# Patient Record
Sex: Female | Born: 1975 | Race: Black or African American | Hispanic: No | Smoking: Never smoker
Health system: Southern US, Community
[De-identification: ages and names within clinical notes are randomized; demographics above are authoritative.]

## PROBLEM LIST (undated history)

## (undated) DIAGNOSIS — O99019 Anemia complicating pregnancy, unspecified trimester: Secondary | ICD-10-CM

## (undated) DIAGNOSIS — G43909 Migraine, unspecified, not intractable, without status migrainosus: Secondary | ICD-10-CM

## (undated) DIAGNOSIS — A749 Chlamydial infection, unspecified: Secondary | ICD-10-CM

## (undated) DIAGNOSIS — L309 Dermatitis, unspecified: Secondary | ICD-10-CM

## (undated) DIAGNOSIS — D249 Benign neoplasm of unspecified breast: Secondary | ICD-10-CM

## (undated) DIAGNOSIS — B379 Candidiasis, unspecified: Secondary | ICD-10-CM

## (undated) DIAGNOSIS — N920 Excessive and frequent menstruation with regular cycle: Secondary | ICD-10-CM

## (undated) DIAGNOSIS — A159 Respiratory tuberculosis unspecified: Secondary | ICD-10-CM

## (undated) DIAGNOSIS — A599 Trichomoniasis, unspecified: Secondary | ICD-10-CM

## (undated) DIAGNOSIS — R011 Cardiac murmur, unspecified: Secondary | ICD-10-CM

## (undated) DIAGNOSIS — L680 Hirsutism: Secondary | ICD-10-CM

## (undated) DIAGNOSIS — L659 Nonscarring hair loss, unspecified: Secondary | ICD-10-CM

## (undated) HISTORY — DX: Candidiasis, unspecified: B37.9

## (undated) HISTORY — DX: Migraine, unspecified, not intractable, without status migrainosus: G43.909

## (undated) HISTORY — PX: ABDOMINAL HYSTERECTOMY: SHX81

## (undated) HISTORY — DX: Excessive and frequent menstruation with regular cycle: N92.0

## (undated) HISTORY — PX: TUMOR REMOVAL: SHX12

## (undated) HISTORY — DX: Chlamydial infection, unspecified: A74.9

## (undated) HISTORY — DX: Trichomoniasis, unspecified: A59.9

## (undated) HISTORY — DX: Benign neoplasm of unspecified breast: D24.9

## (undated) HISTORY — PX: DILATION AND CURETTAGE OF UTERUS: SHX78

## (undated) HISTORY — DX: Anemia complicating pregnancy, unspecified trimester: O99.019

## (undated) HISTORY — DX: Nonscarring hair loss, unspecified: L65.9

## (undated) HISTORY — DX: Hirsutism: L68.0

## (undated) HISTORY — DX: Dermatitis, unspecified: L30.9

## (undated) HISTORY — PX: HYSTEROSCOPY: SHX211

## (undated) HISTORY — PX: WISDOM TOOTH EXTRACTION: SHX21

---

## 1997-09-22 ENCOUNTER — Inpatient Hospital Stay (HOSPITAL_COMMUNITY): Admission: AD | Admit: 1997-09-22 | Discharge: 1997-09-22 | Payer: Self-pay | Admitting: *Deleted

## 1997-10-26 ENCOUNTER — Other Ambulatory Visit: Admission: RE | Admit: 1997-10-26 | Discharge: 1997-10-26 | Payer: Self-pay | Admitting: Obstetrics and Gynecology

## 1997-11-24 ENCOUNTER — Encounter: Payer: Self-pay | Admitting: Obstetrics and Gynecology

## 1997-11-24 ENCOUNTER — Ambulatory Visit (HOSPITAL_COMMUNITY): Admission: RE | Admit: 1997-11-24 | Discharge: 1997-11-24 | Payer: Self-pay | Admitting: Obstetrics and Gynecology

## 1997-12-29 ENCOUNTER — Inpatient Hospital Stay (HOSPITAL_COMMUNITY): Admission: AD | Admit: 1997-12-29 | Discharge: 1997-12-29 | Payer: Self-pay | Admitting: Obstetrics and Gynecology

## 1998-01-25 ENCOUNTER — Ambulatory Visit (HOSPITAL_COMMUNITY): Admission: RE | Admit: 1998-01-25 | Discharge: 1998-01-25 | Payer: Self-pay | Admitting: Obstetrics and Gynecology

## 1998-02-03 ENCOUNTER — Inpatient Hospital Stay (HOSPITAL_COMMUNITY): Admission: AD | Admit: 1998-02-03 | Discharge: 1998-02-03 | Payer: Self-pay | Admitting: Obstetrics and Gynecology

## 1998-03-03 ENCOUNTER — Ambulatory Visit (HOSPITAL_COMMUNITY): Admission: RE | Admit: 1998-03-03 | Discharge: 1998-03-03 | Payer: Self-pay | Admitting: Obstetrics & Gynecology

## 1998-03-03 ENCOUNTER — Encounter: Payer: Self-pay | Admitting: Obstetrics & Gynecology

## 1998-03-05 ENCOUNTER — Observation Stay (HOSPITAL_COMMUNITY): Admission: AD | Admit: 1998-03-05 | Discharge: 1998-03-06 | Payer: Self-pay | Admitting: Obstetrics & Gynecology

## 1998-03-19 ENCOUNTER — Inpatient Hospital Stay (HOSPITAL_COMMUNITY): Admission: AD | Admit: 1998-03-19 | Discharge: 1998-03-19 | Payer: Self-pay | Admitting: Obstetrics and Gynecology

## 1998-03-23 ENCOUNTER — Encounter (HOSPITAL_COMMUNITY): Admission: RE | Admit: 1998-03-23 | Discharge: 1998-04-06 | Payer: Self-pay | Admitting: Obstetrics & Gynecology

## 1998-03-23 ENCOUNTER — Encounter: Payer: Self-pay | Admitting: Obstetrics & Gynecology

## 1998-03-26 ENCOUNTER — Encounter: Payer: Self-pay | Admitting: Obstetrics and Gynecology

## 1998-04-06 ENCOUNTER — Inpatient Hospital Stay (HOSPITAL_COMMUNITY): Admission: AD | Admit: 1998-04-06 | Discharge: 1998-04-10 | Payer: Self-pay | Admitting: Obstetrics and Gynecology

## 1998-12-01 ENCOUNTER — Other Ambulatory Visit: Admission: RE | Admit: 1998-12-01 | Discharge: 1998-12-01 | Payer: Self-pay | Admitting: Obstetrics and Gynecology

## 1999-01-17 DIAGNOSIS — A159 Respiratory tuberculosis unspecified: Secondary | ICD-10-CM

## 1999-01-17 HISTORY — DX: Respiratory tuberculosis unspecified: A15.9

## 1999-02-11 ENCOUNTER — Inpatient Hospital Stay (HOSPITAL_COMMUNITY): Admission: AD | Admit: 1999-02-11 | Discharge: 1999-02-11 | Payer: Self-pay | Admitting: Obstetrics and Gynecology

## 1999-03-02 ENCOUNTER — Ambulatory Visit (HOSPITAL_COMMUNITY): Admission: RE | Admit: 1999-03-02 | Discharge: 1999-03-02 | Payer: Self-pay | Admitting: Obstetrics and Gynecology

## 1999-03-02 ENCOUNTER — Encounter: Payer: Self-pay | Admitting: Obstetrics and Gynecology

## 1999-04-08 ENCOUNTER — Inpatient Hospital Stay (HOSPITAL_COMMUNITY): Admission: AD | Admit: 1999-04-08 | Discharge: 1999-04-10 | Payer: Self-pay | Admitting: Obstetrics and Gynecology

## 1999-06-07 ENCOUNTER — Emergency Department (HOSPITAL_COMMUNITY): Admission: EM | Admit: 1999-06-07 | Discharge: 1999-06-07 | Payer: Self-pay

## 1999-08-01 ENCOUNTER — Encounter: Payer: Self-pay | Admitting: Emergency Medicine

## 1999-08-01 ENCOUNTER — Emergency Department (HOSPITAL_COMMUNITY): Admission: EM | Admit: 1999-08-01 | Discharge: 1999-08-01 | Payer: Self-pay | Admitting: Emergency Medicine

## 2000-01-13 ENCOUNTER — Encounter: Admission: RE | Admit: 2000-01-13 | Discharge: 2000-01-13 | Payer: Self-pay | Admitting: Family Medicine

## 2000-01-13 ENCOUNTER — Encounter: Payer: Self-pay | Admitting: Family Medicine

## 2000-01-19 ENCOUNTER — Emergency Department (HOSPITAL_COMMUNITY): Admission: EM | Admit: 2000-01-19 | Discharge: 2000-01-20 | Payer: Self-pay | Admitting: *Deleted

## 2000-01-19 ENCOUNTER — Encounter: Payer: Self-pay | Admitting: Emergency Medicine

## 2000-01-23 ENCOUNTER — Inpatient Hospital Stay (HOSPITAL_COMMUNITY): Admission: AD | Admit: 2000-01-23 | Discharge: 2000-01-30 | Payer: Self-pay | Admitting: Family Medicine

## 2000-01-23 ENCOUNTER — Encounter: Payer: Self-pay | Admitting: Family Medicine

## 2000-01-26 ENCOUNTER — Encounter: Payer: Self-pay | Admitting: Family Medicine

## 2001-08-22 ENCOUNTER — Encounter: Admission: RE | Admit: 2001-08-22 | Discharge: 2001-08-22 | Payer: Self-pay | Admitting: Family Medicine

## 2001-08-22 ENCOUNTER — Encounter: Payer: Self-pay | Admitting: Family Medicine

## 2002-04-09 ENCOUNTER — Other Ambulatory Visit: Admission: RE | Admit: 2002-04-09 | Discharge: 2002-04-09 | Payer: Self-pay | Admitting: Obstetrics and Gynecology

## 2002-07-23 ENCOUNTER — Emergency Department (HOSPITAL_COMMUNITY): Admission: EM | Admit: 2002-07-23 | Discharge: 2002-07-23 | Payer: Self-pay

## 2002-07-29 ENCOUNTER — Encounter: Admission: RE | Admit: 2002-07-29 | Discharge: 2002-08-28 | Payer: Self-pay | Admitting: Family Medicine

## 2002-11-03 ENCOUNTER — Emergency Department (HOSPITAL_COMMUNITY): Admission: EM | Admit: 2002-11-03 | Discharge: 2002-11-03 | Payer: Self-pay | Admitting: Emergency Medicine

## 2002-11-03 ENCOUNTER — Encounter: Payer: Self-pay | Admitting: Emergency Medicine

## 2004-10-20 ENCOUNTER — Other Ambulatory Visit: Admission: RE | Admit: 2004-10-20 | Discharge: 2004-10-20 | Payer: Self-pay | Admitting: Obstetrics and Gynecology

## 2007-01-03 ENCOUNTER — Ambulatory Visit (HOSPITAL_COMMUNITY): Admission: RE | Admit: 2007-01-03 | Discharge: 2007-01-03 | Payer: Self-pay | Admitting: Obstetrics and Gynecology

## 2007-01-03 ENCOUNTER — Encounter (INDEPENDENT_AMBULATORY_CARE_PROVIDER_SITE_OTHER): Payer: Self-pay | Admitting: Obstetrics and Gynecology

## 2007-09-09 ENCOUNTER — Encounter: Admission: RE | Admit: 2007-09-09 | Discharge: 2007-09-09 | Payer: Self-pay | Admitting: Family Medicine

## 2010-05-31 NOTE — H&P (Signed)
NAMEBREEONA, WAID              ACCOUNT NO.:  [id number]   MEDICAL RECORD NO.:  [id number]          PATIENT TYPE:  AMB   LOCATION:  SDC                           FACILITY:  [facility]   PHYSICIAN:  Hal Morales, M.D.DATE OF BIRTH:  Feb 01, 1975   DATE OF ADMISSION:  01/03/2007  DATE OF DISCHARGE:                              HISTORY & PHYSICAL   HISTORY OF PRESENT ILLNESS:  The patient is a 35 year old black married  female, gravida 4, para 5-0-5-5, who presents for management of  menorrhagia.  The patient began having some menorrhagia as early as  2004; however, this remained fairly manageable.  She was initially  evaluated at that time with no evidence of fibroids.  She was having  some irregularity of her menses and used oral contraceptive pills for a  short period of time to manage that.  She has subsequently been off  birth control pills for almost 4 years, as her husband has had a  vasectomy.  She presented once again in 2006 with a mild anemia and  pica, with a hemoglobin of 10.4, thought to be due to some menorrhagia.  This corrected with iron therapy.  TSH at that time was normal.  The  patient presented most recently on November 3 complaining of menses  every 21 days for 5 days, 3 of those being very heavy.  She had also had  a daily discharge for approximately two months.  She underwent  laboratory studies including a normal TSH and FSH/LH in the 1 to 1  ratio, normal testosterone, negative GC and chlamydia cultures.  She had  likewise had a normal testosterone because, she was complaining of  increased hair growth.  She underwent a sonohysterogram showing 3  echogenic masses within the endometrial cavity, most consistent with  polyps, with the largest measuring 8 x 4 mm.  She presents for  hysteroscopy and resection of polypoid lesions.   PAST MEDICAL HISTORY:  GYN.  The patient underwent menarche at age 65  and had menses as close together as every 18 days, but usually  every 21  days with fairly heavy flow and significant cramping.  The patient had a  history of abnormal pap smears requiring cryotherapy, and they have been  normal since that time.  She also had lumps removed from her breast in  1991 and in 1992, both of which were benign.  She uses vasectomy for  contraception.   OBSTETRIC HISTORY:  The patient had vaginal deliveries in 1996 and 1998.  She had cesarean section for twin gestation in 2000 and a vaginal  delivery in 2001.   MEDICAL HISTORY:  The patient has a history of migraines and anemia, as  well as pica.   FAMILY HISTORY:  Positive for hypertension and sickle cell trait.   REVIEW OF SYSTEMS:  Negative except as mentioned above.   EXAMINATION:  GENERAL:  The patient is a well-developed, black female in  no acute distress.  VITAL SIGNS:  Blood pressure is 102/66, weight is 169 pounds, height it  5 feet 3 and 1/4 inches.  NECK:  Thyroid  is not enlarged.  BREASTS:  Without dominant masses, discharge or skin changes.  LUNGS:  Clear.  HEART:  Regular rate and rhythm.  ABDOMEN:  Soft without masses or organomegaly.  PELVIC:  EGBUS within normal limits.  The vagina is rugose.  The cervix  is without gross lesions.  The uterus is upper limits of normal size,  mobile and nontender.  ADNEXA:  No masses.  RECTOVAGINAL:  No masses.   LABORATORY STUDIES:  As noted above with the uterus measuring 8-1/2 cm x  5.19 cm.   IMPRESSION:  1. Menorrhagia.  2. Resultant anemia.   DISPOSITION:  A discussion was held with the patient concerning options  for management of her menorrhagia.  Clearly, the endometrial lesions  should be resected, and she has consented to that.  She is offered  simultaneous endometrial ablation but declines that, because she does no  want to put herself in a position where she would not be able to have  other children, in spite of the fact that her husband has now had a  vasectomy.  She will thus undergo  hysteroscopic resection of endometrial  masses at [facility] on January 03, 2007.      Hal Morales, M.D.  Electronically Signed     VPH/MEDQ  D:  12/31/2006  T:  12/31/2006  Job:  [id number]

## 2010-05-31 NOTE — Op Note (Signed)
[name], Julie [name]              ACCOUNT NO.:  [id number]   MEDICAL RECORD NO.:  [id number]          PATIENT TYPE:  AMB   LOCATION:  SDC                           [facility]ILITY:  [facility]   PHYSICIAN:  Hal Morales, M.D.DATE OF BIRTH:  10/09/1975   DATE OF PROCEDURE:  01/03/2007  DATE OF DISCHARGE:                               OPERATIVE REPORT   PREOPERATIVE DIAGNOSES:  1. Menorrhagia.  2. Endometrial polyps.   POSTOPERATIVE DIAGNOSES:  1. Menorrhagia.  2. Endometrial polyps.   OPERATION:  Hysteroscopy, D&C.   SURGEON:  Dr. Dierdre Forth.   ANESTHESIA:  General LMA.   ESTIMATED BLOOD LOSS:  Less than 10 mL.   COMPLICATIONS:  None.   FINDINGS:  The endometrium was thickened though the polypoid lesions  that had been noted on sonohysterogram could not be easily recognized.  Once the endometrial curettings were obtained, however, there were  several polypoid pieces within those curettings.  There were no other  endometrial lesions noted.   PROCEDURE:  The patient was taken to the operating room after  appropriate identification and placed on the operating table.  After the  attainment of general anesthesia, she was placed in lithotomy position.  Perineum and vagina were prepped with multiple layers of Betadine and  draped as sterile field.  The bladder was emptied with a red Robinson  catheter.  A Graves speculum was placed in the vagina and a paracervical  block achieved with a total of 10 mL of 2% Xylocaine in the 5 and 7  o'clock positions.  The cervix was grasped with a tenaculum and the  uterus sounded to 7 cm.  The cervix was then dilated to a #31 dilator  and the hysteroscope inserted.  The above-noted findings were made and  documented.  Hysteroscope was removed and all quadrants of the uterus  curetted.  The hysteroscope was reinserted and no further lesion nor  endometrial thickening were observed.  All instruments were  then removed from the vagina and the  patient awakened from general  anesthesia and taken to the recovery room in satisfactory condition  having tolerated the procedure well, with sponge and instrument counts  correct.   SPECIMENS TO PATHOLOGY:  Endometrial curettings with polypoid tissue.      Hal Morales, M.D.  Electronically Signed     VPH/MEDQ  D:  01/03/2007  T:  01/03/2007  Job:  [id number]

## 2010-06-03 NOTE — Discharge Summary (Signed)
[place]  Patient:    Julie [name], Julie [name]                     MRN: [id number] Adm. Date:  [id number] Disc. Date: [id number] Attending:  Beverely Low                           Discharge Summary  HISTORY OF PRESENT ILLNESS:  The patient is a 35 year old female who was admitted to the hospital after having a severe pneumonia which was not responsive to outpatient treatment.  The patient was started on IV antibiotics but still continued to spike a temperature up to 102.  With this, it was felt that we should get an ID consultation which was done.  Lacretia Leigh. Ninetta Lights, M.D., saw this patient  and did change the IV antibiotics.  Question of whether the patient had TB was brought forward. She had AFBs that were done and she had also tuberculin skin tests that were done.  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  At the thought of this, the patient had been placed on isolation and antituberculous drugs were started on this patient.  This was continued until her coughing had stopped.  The patient was then discharged home and would be seen in the office in several days.  It is of note that the patient was discharged on January 30, 2000.  All the rest of her tests came back negative.  She was started on isoniazid 100 mg a day, Robaxin 50 mg a day, ethambutol 700 mg per day, vitamin B6 50, pyrizimide 3 g p.o. q.d.  She will receive these medications from the health department. They will come to her house on a daily basis to administer her antituberculous drugs. DD:  02/23/00 TD:  02/24/00 Job: [id number]

## 2010-06-03 NOTE — H&P (Signed)
[facility]  Patient:    Julie [name], Julie [name]                     MRN: [id number] Adm. Date:  [id number] Attending:  Shaune Spittle Dictator:   Nigel Bridgeman, C.N.M.                         History and Physical  BRIEF HISTORY:  Julie [name] is a 35 year old gravida 4, para 2-1-0-4 at 71 weeks ho presents for induction secondary to post dates.  She was to arrive for induction at 6:30 a.m. today, however, the patient was confused as to the plan and thought she was to present to the office prior to that.  She does report sporadic uterine contractions and reports positive fetal movement.  Pregnancy has been remarkable for: 1. History of rapid labor. 2. Previous low transverse cesarean section for twin gestation with 2 previous    vaginal births. 3. History of cryotherapy. 4. First trimester bleeding. 5. Less than 1 year between pregnancies. 6. Conception on OCPs. 7. Anemia. 8. Slightly late to care.  PRENATAL LABORATORY DATA:  Blood type is A+, rh antibody negative, VDRL nonreactive, rubella titer positive, hepatitis B surface antigen negative, HIV as declined. Sickle cell test was negative from 1999.  Pap was normal.  GC and chlamydia cultures were negative.  Glucose challenge was normal.  Group B strep  culture was negative.  AFP was not done.  Hemoglobin upon entering the practice was 9.6, it was 8.5 at 28 weeks.  EDC of April 01, 1999, was established by ultrasound at approximately 22 weeks secondary to conception on OCPs.  HISTORY OF PRESENT PREGNANCY:  The patient entered care at approximately 21-1/2  weeks.  She elected VBAC.  She was started on iron during her pregnancy.  Her grandmother was hospitalized during the patients pregnancy with TB.  The patient had a negative TB skin test in August.  The rest of her pregnancy was essentially uncomplicated.  She did have a cervical dilatation to 4 cm over the last  2 weeks.  OBSTETRICAL HISTORY:  In 1996 the patient had a vaginal birth of a female infant, weight 7 pounds 13 ounces at [redacted] weeks gestation.  She was in labor approximate  hours.  She had IV medications and epidurals.  She delivered that child in [facility].  In 1998 she had a vaginal birth of a female infant weight 7 pounds 3 ounces at [redacted] weeks gestation.  She was in labor approximately 1 hour.  She had IV pain  medication.  In March of 2000, she had a C section for a 36 week twin gestation. She had two girls.  Twin A weighed 4 pounds 10 ounces, twin B weighed 4 pounds 4 ounces.  They were both in a breech position.  She did have anemia with her previous pregnancies.  MEDICAL HISTORY:  She was on oral contraceptives and Depo-Provera.  She did conceive this pregnancy on OCPs.  She had cryosurgery in 1995 but has had normal Pap since.  She has had breast biopsies in 1991 and 1992 for benign breast lumps. She reports the usual childhood illnesses.  She has had 3 or 4 yeast infections per year.  The only other surgeries were her wisdom teeth removed and her C-section  which was a low transverse in March of 2000.  ALLERGIES:  No known drug allergies.  FAMILY HISTORY:  Her maternal grandmother and maternal aunt are hypertensive and on medication.  Her mother has varicosities.  Her maternal first cousin has insulin-dependent diabetes. Her maternal first cousin also has thyroid problems. Her sister is a smoker.  GENETIC HISTORY:  Is remarkable for the father of the babys first cousin having a child with Downs syndrome.  SOCIAL HISTORY:  The patient is recently married.  The father of the baby is involved and supportive.  His name is Counsellor.  The patient is Tree surgeon.  She is of the [place] faith.  She is college educated.  She has been followed by [facility]. She denies any alcohol, rug or tobacco use during this pregnancy.  She  is employed with the [employer].  PHYSICAL EXAMINATION:  VITAL SIGNS:  Vital signs are stable.  Patient is afebrile.  HEENT:  Within normal limits.  LUNGS:  Breath sounds are clear.  HEART:  Regular rate and rhythm without murmur.  BREASTS:  Soft and nontender.  ABDOMEN:  Fundal height is approximately 38 cm.  Estimated fetal weight is 7 to  7-1/2 pounds.  Uterine contractions are irregular and mild.  Cervical exam per office exam earlier last week was 4 cm vertex presentation.  Fetal heart rate is reassuring.  There are no decelerations noted.  EXTREMITIES:  Deep tendon reflexes are 2+ without clonus.  There is a trace edema noted.  IMPRESSION: 1. Intrauterine pregnancy at 41 weeks. 2. Advanced cervical change. 3. Anemia. 4. Previous low transverse cesarean section with 2 previous vaginal births. 5. Less than months between pregnancies. 6. Anemia. 7. History of cryotherapy.  PLAN: 1. Admit to birthing suite for consult with Dr. Dierdre Forth as attending    physician. 2. Routine physician orders. 3. Dr. Pennie Rushing plans artificial rupture of membranes. 4. Anticipate normal spontaneous vaginal birth. DD:  04/08/99 TD:  04/08/99 Job: [id number]

## 2010-10-21 LAB — CBC
HCT: 29.7 — ABNORMAL LOW
Hemoglobin: 9.8 — ABNORMAL LOW
MCHC: 33.1
MCV: 83
Platelets: 354
RBC: 3.57 — ABNORMAL LOW
RDW: 17.4 — ABNORMAL HIGH
WBC: 4.7

## 2010-10-21 LAB — PREGNANCY, URINE: Preg Test, Ur: NEGATIVE

## 2011-05-04 ENCOUNTER — Ambulatory Visit: Payer: Self-pay | Admitting: Obstetrics and Gynecology

## 2011-06-09 ENCOUNTER — Telehealth: Payer: Self-pay | Admitting: Obstetrics and Gynecology

## 2011-06-09 NOTE — Telephone Encounter (Signed)
Pt c/o symptoms of BV. Informed pt that she must be seen before anything can be prescribed. Pt has appt with Dr. Pennie Rushing 06/22/11 and will discuss problems at that appt. Pt voiced understanding.

## 2011-06-09 NOTE — Telephone Encounter (Signed)
Triage/epic/gen. [facility].

## 2011-06-22 ENCOUNTER — Encounter: Payer: Self-pay | Admitting: Obstetrics and Gynecology

## 2011-06-22 ENCOUNTER — Ambulatory Visit (INDEPENDENT_AMBULATORY_CARE_PROVIDER_SITE_OTHER): Payer: BC Managed Care – PPO | Admitting: Obstetrics and Gynecology

## 2011-06-22 VITALS — BP 128/68 | Ht 64.0 in | Wt 176.0 lb

## 2011-06-22 DIAGNOSIS — N920 Excessive and frequent menstruation with regular cycle: Secondary | ICD-10-CM

## 2011-06-22 DIAGNOSIS — Z309 Encounter for contraceptive management, unspecified: Secondary | ICD-10-CM

## 2011-06-22 MED ORDER — ERGOCALCIFEROL 1.25 MG (50000 UT) PO CAPS: ORAL_CAPSULE | ORAL | Status: DC

## 2011-06-22 MED ORDER — MISOPROSTOL 200 MCG PO TABS: ORAL_TABLET | ORAL | Status: DC

## 2011-06-22 NOTE — Progress Notes (Signed)
Pt here regarding change in b/c. Currently using the NuvaRing. States that she is getting off schedule with that and the price has gone up. Wanting to talk about something more permanent. Also c/o itching, uncomfortable feeling in vaginal area. Was seen by family doctor. Was told that nothing was seen wanting to get that checked today. No odor small yellow d/c. Pt request std testing   Subjective: The patient wants to change to a different birth control method but does not want permanent sterilization. She has heavy menstrual periods unless she uses the NuvaRing and therefore wants something that will assist with that as well  Objective: BP 128/68  Ht 5\' 4"  (1.626 m)  Wt 176 lb (79.833 kg)  BMI 30.21 kg/m2  LMP 05/25/2011   Pelvic: Pelvic exam: normal external genitalia, vulva, vagina, cervix, uterus and adnexa, VULVA: normal appearing vulva with no masses, tenderness or lesions, VAGINA: normal appearing vagina with normal color and discharge, no lesions, CERVIX: normal appearing cervix without discharge or lesions, UTERUS: nl shape, consistency and nontender, upper limits normal size, ADNEXA: normal adnexa in size, nontender and no masses  Impression: Desires long-term contraception   History of menorrhagia   Desires STD testing  Recommendations: GC chlamydia HIV and RPR were performed    Discussion held with the patient concerning options for contraception. The Mirena IUD seems a good choice for her. The risks and benefits of that method were reviewed. She wishes to have the Mirena IUD inserted as soon as possible. She is currently due to discontinue her NuvaRing so will have no breaking contraception.Marland Kitchen

## 2011-06-22 NOTE — Patient Instructions (Signed)
Levonorgestrel intrauterine device (IUD)  MIRENA What is this medicine? LEVONORGESTREL IUD (LEE voe nor jes trel) is a contraceptive (birth control) device. It is used to prevent pregnancy and to treat heavy bleeding that occurs during your period. It can be used for up to 5 years. This medicine may be used for other purposes; ask your health care provider or pharmacist if you have questions. What should I tell my health care provider before I take this medicine? They need to know if you have any of these conditions: -abnormal Pap smear -cancer of the breast, uterus, or cervix -diabetes -endometritis -genital or pelvic infection now or in the past -have more than one sexual partner or your partner has more than one partner -heart disease -history of an ectopic or tubal pregnancy -immune system problems -IUD in place -liver disease or tumor -problems with blood clots or take blood-thinners -use intravenous drugs -uterus of unusual shape -vaginal bleeding that has not been explained -an unusual or allergic reaction to levonorgestrel, other hormones, silicone, or polyethylene, medicines, foods, dyes, or preservatives -pregnant or trying to get pregnant -breast-feeding How should I use this medicine? This device is placed inside the uterus by a health care professional. Talk to your pediatrician regarding the use of this medicine in children. Special care may be needed. Overdosage: If you think you have taken too much of this medicine contact a poison control center or emergency room at once. NOTE: This medicine is only for you. Do not share this medicine with others. What if I miss a dose? This does not apply. What may interact with this medicine? Do not take this medicine with any of the following medications: -amprenavir -bosentan -fosamprenavir This medicine may also interact with the following medications: -aprepitant -barbiturate medicines for inducing sleep or treating  seizures -bexarotene -griseofulvin -medicines to treat seizures like carbamazepine, ethotoin, felbamate, oxcarbazepine, phenytoin, topiramate -modafinil -pioglitazone -rifabutin -rifampin -rifapentine -some medicines to treat HIV infection like atazanavir, indinavir, lopinavir, nelfinavir, tipranavir, ritonavir -St. John's wort -warfarin This list may not describe all possible interactions. Give your health care provider a list of all the medicines, herbs, non-prescription drugs, or dietary supplements you use. Also tell them if you smoke, drink alcohol, or use illegal drugs. Some items may interact with your medicine. What should I watch for while using this medicine? Visit your doctor or health care professional for regular check ups. See your doctor if you or your partner has sexual contact with others, becomes HIV positive, or gets a sexual transmitted disease. This product does not protect you against HIV infection (AIDS) or other sexually transmitted diseases. You can check the placement of the IUD yourself by reaching up to the top of your vagina with clean fingers to feel the threads. Do not pull on the threads. It is a good habit to check placement after each menstrual period. Call your doctor right away if you feel more of the IUD than just the threads or if you cannot feel the threads at all. The IUD may come out by itself. You may become pregnant if the device comes out. If you notice that the IUD has come out use a backup birth control method like condoms and call your health care provider. Using tampons will not change the position of the IUD and are okay to use during your period. What side effects may I notice from receiving this medicine? Side effects that you should report to your doctor or health care professional as soon as possible: -  allergic reactions like skin rash, itching or hives, swelling of the face, lips, or tongue -fever, flu-like symptoms -genital sores -high  blood pressure -no menstrual period for 6 weeks during use -pain, swelling, warmth in the leg -pelvic pain or tenderness -severe or sudden headache -signs of pregnancy -stomach cramping -sudden shortness of breath -trouble with balance, talking, or walking -unusual vaginal bleeding, discharge -yellowing of the eyes or skin Side effects that usually do not require medical attention (report to your doctor or health care professional if they continue or are bothersome): -acne -breast pain -change in sex drive or performance -changes in weight -cramping, dizziness, or faintness while the device is being inserted -headache -irregular menstrual bleeding within first 3 to 6 months of use -nausea This list may not describe all possible side effects. Call your doctor for medical advice about side effects. You may report side effects to FDA at 1-800-FDA-1088. Where should I keep my medicine? This does not apply. NOTE: This sheet is a summary. It may not cover all possible information. If you have questions about this medicine, talk to your doctor, pharmacist, or health care provider.  2012, Elsevier/Gold Standard. (01/24/2008 6:39:08 PM)

## 2011-06-23 ENCOUNTER — Encounter: Payer: Self-pay | Admitting: Obstetrics and Gynecology

## 2011-06-23 ENCOUNTER — Encounter: Payer: BC Managed Care – PPO | Admitting: Obstetrics and Gynecology

## 2011-06-23 ENCOUNTER — Ambulatory Visit (INDEPENDENT_AMBULATORY_CARE_PROVIDER_SITE_OTHER): Payer: BC Managed Care – PPO | Admitting: Obstetrics and Gynecology

## 2011-06-23 VITALS — BP 140/78 | Temp 98.9°F | Ht 64.0 in | Wt 176.0 lb

## 2011-06-23 DIAGNOSIS — N920 Excessive and frequent menstruation with regular cycle: Secondary | ICD-10-CM | POA: Insufficient documentation

## 2011-06-23 DIAGNOSIS — IMO0001 Reserved for inherently not codable concepts without codable children: Secondary | ICD-10-CM

## 2011-06-23 DIAGNOSIS — Z309 Encounter for contraceptive management, unspecified: Secondary | ICD-10-CM

## 2011-06-23 DIAGNOSIS — Z30430 Encounter for insertion of intrauterine contraceptive device: Secondary | ICD-10-CM

## 2011-06-23 LAB — HEPATITIS C ANTIBODY: HCV Ab: NEGATIVE

## 2011-06-23 LAB — HIV ANTIBODY (ROUTINE TESTING W REFLEX): HIV: NONREACTIVE

## 2011-06-23 LAB — GC/CHLAMYDIA PROBE AMP, GENITAL
Chlamydia, DNA Probe: NEGATIVE
GC Probe Amp, Genital: NEGATIVE

## 2011-06-23 LAB — POCT URINE PREGNANCY: Preg Test, Ur: NEGATIVE

## 2011-06-23 MED ORDER — LEVONORGESTREL 20 MCG/24HR IU IUD
INTRAUTERINE_SYSTEM | Freq: Once | INTRAUTERINE | Status: AC
  Administered 2011-06-23: 13:00:00 via INTRAUTERINE

## 2011-06-23 NOTE — Patient Instructions (Addendum)
Intrauterine Device Insertion Care After Refer to this sheet in the next few weeks. These instructions provide you with information on caring for yourself after your procedure. Your caregiver may also give you more specific instructions. Your treatment has been planned according to current medical practices, but problems sometimes occur. Call your caregiver if you have any problems or questions after your procedure. HOME CARE INSTRUCTIONS   Only take over-the-counter or prescription medicines for pain, discomfort, or fever as directed by your caregiver. Do not use aspirin. This may increase bleeding.   Check your IUD to make sure it is in place before you resume sexual activity. You should be able to feel the strings. If you cannot feel the strings, something may be wrong. The IUD may have fallen out of the uterus, or the uterus may have been punctured (perforated) during placement. Also, if the strings are getting longer, it may mean that the IUD is being forced out of the uterus. You no longer have full protection from pregnancy if any of these problems occur.   You may resume sexual intercourse if you are not having problems with the IUD. The IUD is considered immediately effective.   You may resume normal activities.   Keep all follow-up appointments to be sure your IUD has remained in place. After the first exam, yearly exams are advised, unless you cannot feel the strings of your IUD.   Continue to check that the IUD is still in place by feeling for the strings after every menstrual period.  SEEK MEDICAL CARE IF:   You have bleeding that is heavier or lasts longer than a normal menstrual cycle.   You have a fever.   You have increasing cramps or abdominal pain not relieved with medicine.   You have abdominal pain that does not seem to be related to the same area of earlier cramping and pain.   You are lightheaded, unusually weak, or faint.   You have abnormal vaginal discharge or  smells.   You have pain during sexual intercourse.   You cannot feel the IUD strings, or the IUD string has gotten longer.   You feel the IUD at the opening of the cervix in the vagina.   You think you are pregnant, or you miss your menstrual period.   The IUD string is hurting your sex partner.  Document Released: 08/31/2010 Document Revised: 12/22/2010 Document Reviewed: 08/31/2010 [facility] Patient Information 2012 [place], [place].  D/C Nuvaring on 06/26/11

## 2011-06-23 NOTE — Progress Notes (Signed)
IUD: MIRENA LOT#: [id number] Exp: 09/15 UPT: negative GC/CHLAMYDIA: negative CONSENT SIGNED: yes DISINFECTION WITH Betadine X3 UTERUS SOUNDED AT 7 CM IUD INSERTED PER PROTOCOLyes COMPLICATION:none PATIENT INSTRUCTED TO CALL IS FEVER OR ABNORMAL PAIN:yes PATIENT INSTRUCTED ON HOW TO CHECK IUD STRINGS: yes FOLLOW UP APPT: 6weeks  D/C NUVARING ON 06/26/11

## 2011-07-03 ENCOUNTER — Telehealth: Payer: Self-pay | Admitting: Obstetrics and Gynecology

## 2011-07-03 NOTE — Telephone Encounter (Signed)
Tc to pt per telephone call. Pt c/o vaginal discharge with odor. C/o vaginal and anal itching s/p Mirena insertion. Wants a rx for BV. Informed pt needs appt for eval. Pt will call back to sched an appt for eval. Pt told all STD testing negative from 06/23/11. Pt agrees.

## 2011-07-03 NOTE — Telephone Encounter (Signed)
Lm on vm to cb per telephone call.  

## 2011-08-08 ENCOUNTER — Ambulatory Visit (INDEPENDENT_AMBULATORY_CARE_PROVIDER_SITE_OTHER): Payer: BC Managed Care – PPO | Admitting: Obstetrics and Gynecology

## 2011-08-08 ENCOUNTER — Encounter: Payer: Self-pay | Admitting: Obstetrics and Gynecology

## 2011-08-08 VITALS — BP 118/76 | HR 74 | Wt 179.0 lb

## 2011-08-08 DIAGNOSIS — Z124 Encounter for screening for malignant neoplasm of cervix: Secondary | ICD-10-CM

## 2011-08-08 NOTE — Progress Notes (Signed)
AEX  Subjective:    Julie [name] is a 36 y.o. female, [id number], who presents for an annual exam. She had a Mirena IUD placed 6 weeks ago.The patient reports:normal menses, no abnormal bleeding, pelvic pain or discharge  Contraception:IUD Last mammogram: patient has never had a mammogram  Last pap: was normal October  2012.  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 Marital Status: Married    Spouse Name: N/A    Number of Children: N/A  . Years of Education: N/A   Social History Main Topics  . Smoking status: Never Smoker   . Smokeless tobacco: Never Used  . Alcohol Use: No  . Drug Use: No  . Sexually Active: Yes    Birth Control/ Protection: Inserts   Other Topics Concern  . None   Social History Narrative  . None    Menstrual cycle:   LMP: Patient's last menstrual period was 07/31/2011.           Cycle: see above  The following portions of the patient's history were reviewed and updated as appropriate: allergies, current medications, past family history, past medical history, past social history, past surgical history and problem list.  Review of Systems Pertinent items are noted in HPI. Breast:Negative for breast lump,nipple discharge or nipple retraction Gastrointestinal: Negative for abdominal pain, change in bowel habits or rectal bleeding Urinary:negative   Objective:    BP 118/76  Pulse 74  Wt 179 lb (81.194 kg)  LMP 07/31/2011    Weight:  Wt Readings from Last 1 Encounters:  08/08/11 179 lb (81.194 kg)          BMI: There is no height on file to calculate BMI.  General Appearance: Alert, appropriate appearance for age. No acute  distress HEENT: Grossly normal Neck / Thyroid: Supple, no masses, nodes or enlargement Lungs: clear to auscultation bilaterally Back: No CVA tenderness Breast Exam: No masses or nodes.No dimpling, nipple retraction or discharge. Cardiovascular: Regular rate and rhythm. S1, S2, no murmur Gastrointestinal: Soft, non-tender, no masses or organomegaly Pelvic Exam: External genitalia: normal general appearance Urinary system: urethral meatus normal Vaginal: normal mucosa without prolapse or lesions Cervix: normal appearance and IUD string visualized Adnexa: normal bimanual exam Uterus: upper limits nl sized Rectovaginal: normal rectal, no masses Lymphatic Exam: Non-palpable nodes in neck, clavicular, axillary, or inguinal regions Skin: no rash or abnormalities Neurologic: Normal gait and speech, no tremor  Psychiatric: Alert and oriented, appropriate affect.   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  RTO  For eval if no improvement STD screening: declined Contraception:IUD      HAYGOOD,VANESSA PMD

## 2011-08-08 NOTE — Progress Notes (Signed)
Pt is here today for a 6 week follow -up on her IUD. Pt stated cycle was a little longer and lighter with some cramping. No other issues .

## 2011-08-09 ENCOUNTER — Telehealth: Payer: Self-pay
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
## 2011-08-09 NOTE — Telephone Encounter (Signed)
Tc from pt per telephone call. Told pt vph recs as follows: Informed pt to try otc Liquibeads 2x/weekly. Pt to call the office for an appt if sx's reocur. Pt voices understanding.

## 2011-08-10 LAB — PAP IG W/ RFLX HPV ASCU

## 2011-11-26 ENCOUNTER — Emergency Department (HOSPITAL_BASED_OUTPATIENT_CLINIC_OR_DEPARTMENT_OTHER)
Admission: EM | Admit: 2011-11-26 | Discharge: 2011-11-26 | Disposition: A | Discharge status: Home / Self Care | Payer: No Typology Code available for payment source | Attending: Emergency Medicine | Admitting: Emergency Medicine

## 2011-11-26 ENCOUNTER — Encounter (HOSPITAL_BASED_OUTPATIENT_CLINIC_OR_DEPARTMENT_OTHER): Payer: Self-pay | Admitting: *Deleted

## 2011-11-26 DIAGNOSIS — Z862 Personal history of diseases of the blood and blood-forming organs and certain disorders involving the immune mechanism: Secondary | ICD-10-CM | POA: Insufficient documentation

## 2011-11-26 DIAGNOSIS — Z8619 Personal history of other infectious and parasitic diseases: Secondary | ICD-10-CM | POA: Insufficient documentation

## 2011-11-26 DIAGNOSIS — N920 Excessive and frequent menstruation with regular cycle: Secondary | ICD-10-CM | POA: Insufficient documentation

## 2011-11-26 DIAGNOSIS — Y9389 Activity, other specified: Secondary | ICD-10-CM | POA: Insufficient documentation

## 2011-11-26 DIAGNOSIS — Z79899 Other long term (current) drug therapy: Secondary | ICD-10-CM | POA: Insufficient documentation

## 2011-11-26 DIAGNOSIS — L680 Hirsutism: Secondary | ICD-10-CM | POA: Insufficient documentation

## 2011-11-26 DIAGNOSIS — T148XXA Other injury of unspecified body region, initial encounter: Secondary | ICD-10-CM

## 2011-11-26 DIAGNOSIS — IMO0002 Reserved for concepts with insufficient information to code with codable children: Secondary | ICD-10-CM | POA: Insufficient documentation

## 2011-11-26 DIAGNOSIS — G43909 Migraine, unspecified, not intractable, without status migrainosus: Secondary | ICD-10-CM | POA: Insufficient documentation

## 2011-11-26 DIAGNOSIS — D249 Benign neoplasm of unspecified breast: Secondary | ICD-10-CM | POA: Insufficient documentation

## 2011-11-26 MED ORDER — IBUPROFEN 800 MG PO TABS: 800.0000 mg | ORAL_TABLET | Freq: Three times a day (TID) | ORAL | Status: DC
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
## 2011-11-27 NOTE — ED Provider Notes (Signed)
Medical screening examination/treatment/procedure(s) were performed by non-physician practitioner and as supervising physician I was immediately available for consultation/collaboration.  Geoffery Lyons, MD 11/27/11 [phone]

## 2012-03-06 ENCOUNTER — Encounter: Payer: Self-pay | Admitting: Obstetrics and Gynecology

## 2012-03-06 ENCOUNTER — Ambulatory Visit: Payer: BC Managed Care – PPO | Admitting: Obstetrics and Gynecology

## 2012-03-06 VITALS — BP 110/72 | Temp 97.8°F | Wt 176.0 lb

## 2012-03-06 DIAGNOSIS — B9689 Other specified bacterial agents as the cause of diseases classified elsewhere: Secondary | ICD-10-CM

## 2012-03-06 LAB — POCT WET PREP (WET MOUNT): pH: 5

## 2012-03-06 MED ORDER — AMBULATORY NON FORMULARY MEDICATION: 600.0000 mg | Status: DC

## 2012-03-06 MED ORDER — METRONIDAZOLE 0.75 % VA GEL: 1.0000 | Freq: Two times a day (BID) | VAGINAL | Status: DC

## 2012-03-06 MED ORDER — METRONIDAZOLE 500 MG PO TABS: 500.0000 mg | ORAL_TABLET | Freq: Two times a day (BID) | ORAL | Status: DC

## 2012-03-06 NOTE — Progress Notes (Signed)
36 YO complaining of vaginal discharge with burning x [redacted] weeks along with vaginal odor.  O: Pelvic: EGBUS-wnl, vagina-thin grey discharge, cervix-string visible  Wet prep- ph-5.0, whiff-positive,  many clue cells  A: Bacterial Vaginosis  P: Boric Acid Capsules 600 mg  #30 1 pv twice weekly x 12 weeks then prn      Metrogel Vaginal  #1 1 appl pv qd x 5 days no refills      Perineal hygiene      RTO-as scheduled or prn

## 2012-03-06 NOTE — Patient Instructions (Addendum)
Avoid: - excess soap on genital area (consider using plain oatmeal soap) - use of powder or sprays in genital area - douching - wearing underwear to bed (except with menses) - using more than is directed detergent when washing clothes - tight fitting garments around genital area - excess sugar intake  [place] pharmacies that carry Boric Acid Capsules/Suppositories:  [facility] [address] (only)  [phone]  [facility]  [address] ([facility])  [phone]  [facility] ([place])  [address] [phone]  [facility]  [address] [phone]  [facility] [address] [place], [place] [phone]     

## 2012-03-06 NOTE — Progress Notes (Signed)
Vaginal discharge: clearthin mucoid Itching / Burning: yes Fever: no  Symptoms have been present for 2 weeks. Has used over-the-counter treatment: no Associated symptoms:  Pelvic pain: no       Dyspareunia: no     Odor:  yes  History of STD:  trich STD screen:declined

## 2012-04-11 ENCOUNTER — Other Ambulatory Visit: Payer: Self-pay | Admitting: Obstetrics and Gynecology

## 2012-04-11 LAB — CBC
HCT: 38.3 % (ref 36.0–46.0)
Hemoglobin: 12.8 g/dL (ref 12.0–15.0)
MCHC: 33.4 g/dL (ref 30.0–36.0)
RBC: 4.17 MIL/uL (ref 3.87–5.11)

## 2012-04-14 LAB — VITAMIN D PNL(25-HYDRXY+1,25-DIHY)-BLD: Vit D, 25-Hydroxy: <10 ng/mL — ABNORMAL LOW (ref 30–89)

## 2012-04-25 ENCOUNTER — Ambulatory Visit
Admission: RE | Admit: 2012-04-25 | Discharge: 2012-04-25 | Disposition: A | Discharge status: Home / Self Care | Payer: BC Managed Care – PPO | Source: Ambulatory Visit

## 2012-04-25 ENCOUNTER — Other Ambulatory Visit: Payer: Self-pay

## 2012-04-25 DIAGNOSIS — R51 Headache: Secondary | ICD-10-CM

## 2012-04-25 DIAGNOSIS — R202 Paresthesia of skin: Secondary | ICD-10-CM

## 2012-11-29 ENCOUNTER — Encounter (HOSPITAL_COMMUNITY): Payer: Self-pay | Admitting: *Deleted

## 2012-12-02 ENCOUNTER — Other Ambulatory Visit: Payer: Self-pay | Admitting: Obstetrics and Gynecology

## 2012-12-09 ENCOUNTER — Encounter (HOSPITAL_COMMUNITY): Payer: Self-pay | Admitting: Pharmacist

## 2012-12-19 ENCOUNTER — Encounter (HOSPITAL_COMMUNITY): Payer: Self-pay | Admitting: Obstetrics and Gynecology

## 2012-12-19 NOTE — H&P (Signed)
Julie [name] is an 37 y.o. female.presents for evaluation and management of IM bleeding which started in 08/2012 with heavier than nl menses and bleeding between cycles through the 10/2012 cycle.  Has also had 2 years of vulvar irritation intermittently which has been unresponsive to treatment for monilia.  Pertinent Gynecological History: Menses: flow is moderate, regular every month with spotting approximately 3 days per month, usually lasting less than 6 days and with minimal cramping Bleeding: intermenstrual bleeding Contraception: IUD DES exposure: denies Blood transfusions: none Sexually transmitted diseases: currently at risk Previous GYN Procedures: cryo and DNC  Last mammogram: n/a Date: n/a  Last pap: normal Date: 07/2012 OB History: G3, P4   Menstrual History: Menarche age:9 Patient's last menstrual period was 11/23/2012.Bled for 5 days.  Just starting to bleed this am.    Past Medical History  Diagnosis Date  . Yeast infection   . Trichomonas   . Chlamydia   . Migraine   . Hirsutism   . Menorrhagia   . Fibroadenoma of breast   . Alopecia     h/o  . SVD (spontaneous vaginal delivery)     x 3  . Heart murmur     as child - resolved as adult - no problem  . 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 Anemia during pregnancy     history only    Past Surgical History  Procedure Laterality Date  . Cesarean section    . Dilation and curettage of uterus    . Hysteroscopy    . Tumor removal      breast - right  . Wisdom tooth extraction      Family History  Problem Relation Age of Onset  . Hypertension Maternal Grandmother   . Diabetes Other   . Stroke Other   . Cancer Other   . Hypertension Maternal Aunt     Social History:  reports that she has never smoked. She has never used smokeless tobacco. She reports that she does not drink alcohol or use illicit drugs.  Allergies: No Known Allergies  Prescriptions prior to admission  Medication Sig Dispense  Refill  . Ascorbic Acid (VITAMIN C PO) Take by mouth daily.      . Aspirin-Salicylamide-Caffeine (BC HEADACHE PO) Take by mouth as needed.      . fluconazole (DIFLUCAN) 150 MG tablet Take 150 mg by mouth once.      Marland Kitchen levonorgestrel (MIRENA) 20 MCG/24HR IUD 1 each by Intrauterine route once.      . topiramate (TOPAMAX) 25 MG tablet Take 75 mg by mouth at bedtime.      Marland Kitchen ibuprofen (ADVIL,MOTRIN) 800 MG tablet Take 1 tablet (800 mg total) by mouth 3 (three) times daily.  21 tablet  0    Review of Systems  Constitutional: Negative.   HENT:       Menstrual migraines plus one to two others.  Now fewer headaches since starting Botox injections.  Respiratory: Negative.   Cardiovascular: Negative.   Gastrointestinal: Negative.   Genitourinary: Negative.   Musculoskeletal: Negative.   Skin: Negative.   Neurological: Positive for headaches.  Endo/Heme/Allergies: Negative.   Psychiatric/Behavioral: Negative.     Height 5\' 4"  (1.626 m), weight 185 lb (83.915 kg), last menstrual period 11/23/2012. Physical Exam  Constitutional: She is oriented to person, place, and time. She appears well-developed.  HENT:  Head: Normocephalic and atraumatic.  Eyes: Conjunctivae and EOM are normal.  Neck: Normal range of motion. Neck  supple.  Cardiovascular: Normal rate and regular rhythm.   Respiratory: Effort normal and breath sounds normal.  GI: Soft. Bowel sounds are normal.  Genitourinary: Vagina normal.  emale Genitalia: Vulva: no masses, atrophy, or lesions. Vagina: no tenderness, erythema, cystocele, rectocele, abnormal vaginal discharge, or vesicle(s) or ulcers. Cervix: no discharge or cervical motion tenderness and grossly normal; IUD string noted. Uterus: normal size and shape and midline, mobile, non-tender, and no uterine prolapse. Bladder/Urethra: no urethral discharge or mass and normal meatus, bladder non distended, and Urethra well supported. Adnexa/Parametria: no parametrial tenderness or mass  and no adnexal tenderness or ovarian mass.  Musculoskeletal: Normal range of motion.  Neurological: She is alert and oriented to person, place, and time.  Skin: Skin is warm and dry.  Psychiatric: She has a normal mood and affect.  TSH, pap nl SHG = 5cm intramural fibroid.  Intracavitary lesion consistent with a fibroid that is >50% within the cavity and IUD appropriately placed  Results for orders placed during the hospital encounter of 12/20/12 (from the past 24 hour(s))  PREGNANCY, URINE     Status: None   Collection Time    12/20/12  7:40 AM      Result Value Range   Preg Test, Ur NEGATIVE  NEGATIVE  CBC     Status: None   Collection Time    12/20/12  8:17 AM      Result Value Range   WBC 5.7  4.0 - 10.5 K/uL   RBC 3.94  3.87 - 5.11 MIL/uL   Hemoglobin 12.6  12.0 - 15.0 g/dL   HCT 16.1  09.6 - 04.5 %   MCV 92.1  78.0 - 100.0 fL   MCH 32.0  26.0 - 34.0 pg   MCHC 34.7  30.0 - 36.0 g/dL   RDW 40.9  81.1 - 91.4 %   Platelets 248  150 - 400 K/uL    No results found.  Assessment Recent onset IM bleeding  Uterine fibroids  External vulvar itching   Plan  Reviewed options for management of intracavitary lesion with abnormal bleeding. Spectrum from hysteroscopic myomectomy to hysterectomy discussed with risks and benefits of each reviewed including possibility that bleeding will not be relieved by hysteroscopic resection.hysteroscopic removal of intracavitary lesion recommended and accepted. Risks and benefits reviewed including risk of perforation requiring abortion of the procedure and the risk of inadvertent IUD removal.  Pt acknowledges understanding and wants to proceed with currently ordered procedure. Plan Vulvar  biopsy at time of surgery since  sx not relieved by recent treatment.  HAYGOOD,VANESSA P 12/20/2012, 8:59 AM

## 2012-12-20 ENCOUNTER — Encounter (HOSPITAL_COMMUNITY)
Admission: RE | Disposition: A | Discharge status: Home / Self Care | Payer: Self-pay | Source: Ambulatory Visit | Attending: Obstetrics and Gynecology

## 2012-12-20 ENCOUNTER — Encounter (HOSPITAL_COMMUNITY): Payer: BC Managed Care – PPO | Admitting: Anesthesiology

## 2012-12-20 ENCOUNTER — Encounter (HOSPITAL_COMMUNITY): Payer: Self-pay | Admitting: Anesthesiology

## 2012-12-20 ENCOUNTER — Ambulatory Visit (HOSPITAL_COMMUNITY)
Admission: RE | Admit: 2012-12-20 | Discharge: 2012-12-20 | Disposition: A | Discharge status: Home / Self Care | Payer: BC Managed Care – PPO | Source: Ambulatory Visit | Attending: Obstetrics and Gynecology | Admitting: Obstetrics and Gynecology

## 2012-12-20 ENCOUNTER — Ambulatory Visit (HOSPITAL_COMMUNITY): Payer: BC Managed Care – PPO | Admitting: Anesthesiology

## 2012-12-20 DIAGNOSIS — N938 Other specified abnormal uterine and vaginal bleeding: Secondary | ICD-10-CM | POA: Insufficient documentation

## 2012-12-20 DIAGNOSIS — N949 Unspecified condition associated with female genital organs and menstrual cycle: Secondary | ICD-10-CM | POA: Insufficient documentation

## 2012-12-20 DIAGNOSIS — D250 Submucous leiomyoma of uterus: Secondary | ICD-10-CM | POA: Insufficient documentation

## 2012-12-20 DIAGNOSIS — Z975 Presence of (intrauterine) contraceptive device: Secondary | ICD-10-CM

## 2012-12-20 DIAGNOSIS — L293 Anogenital pruritus, unspecified: Secondary | ICD-10-CM | POA: Insufficient documentation

## 2012-12-20 DIAGNOSIS — N906 Unspecified hypertrophy of vulva: Secondary | ICD-10-CM | POA: Insufficient documentation

## 2012-12-20 HISTORY — PX: VULVA /PERINEUM BIOPSY: SHX319

## 2012-12-20 HISTORY — DX: Respiratory tuberculosis unspecified: A15.9

## 2012-12-20 HISTORY — PX: DILITATION & CURRETTAGE/HYSTROSCOPY WITH VERSAPOINT RESECTION: SHX5571

## 2012-12-20 HISTORY — DX: Cardiac murmur, unspecified: R01.1

## 2012-12-20 LAB — CBC
Hemoglobin: 12.6 g/dL (ref 12.0–15.0)
MCH: 32 pg (ref 26.0–34.0)
MCHC: 34.7 g/dL (ref 30.0–36.0)
MCV: 92.1 fL (ref 78.0–100.0)
Platelets: 248 10*3/uL (ref 150–400)
RBC: 3.94 MIL/uL (ref 3.87–5.11)
RDW: 13.4 % (ref 11.5–15.5)
WBC: 5.7 10*3/uL (ref 4.0–10.5)

## 2012-12-20 LAB — PREGNANCY, URINE: Preg Test, Ur: NEGATIVE

## 2012-12-20 SURGERY — DILATATION & CURETTAGE/HYSTEROSCOPY WITH VERSAPOINT RESECTION
Anesthesia: General

## 2012-12-20 MED ORDER — MIDAZOLAM HCL 2 MG/2ML IJ SOLN
INTRAMUSCULAR | Status: AC
  Filled 2012-12-20: qty 2

## 2012-12-20 MED ORDER — LIDOCAINE HCL 2 % IJ SOLN
INTRAMUSCULAR | Status: AC
  Filled 2012-12-20: qty 20

## 2012-12-20 MED ORDER — LACTATED RINGERS IV SOLN
INTRAVENOUS | Status: DC
  Administered 2012-12-20 (×3): via INTRAVENOUS

## 2012-12-20 MED ORDER — MIDAZOLAM HCL 2 MG/2ML IJ SOLN
INTRAMUSCULAR | Status: DC | PRN
  Administered 2012-12-20: 2 mg via INTRAVENOUS

## 2012-12-20 MED ORDER — LIDOCAINE HCL 2 % IJ SOLN
INTRAMUSCULAR | Status: DC | PRN
  Administered 2012-12-20: 10 mL

## 2012-12-20 MED ORDER — PROPOFOL 10 MG/ML IV EMUL
INTRAVENOUS | Status: AC
  Filled 2012-12-20: qty 20

## 2012-12-20 MED ORDER — FENTANYL CITRATE 0.05 MG/ML IJ SOLN
INTRAMUSCULAR | Status: AC
  Filled 2012-12-20: qty 2

## 2012-12-20 MED ORDER — KETOROLAC TROMETHAMINE 30 MG/ML IJ SOLN
INTRAMUSCULAR | Status: DC | PRN
  Administered 2012-12-20: 30 mg via INTRAMUSCULAR
  Administered 2012-12-20: 30 mg via INTRAVENOUS

## 2012-12-20 MED ORDER — KETOROLAC TROMETHAMINE 30 MG/ML IJ SOLN
INTRAMUSCULAR | Status: AC
  Filled 2012-12-20: qty 1

## 2012-12-20 MED ORDER — FENTANYL CITRATE 0.05 MG/ML IJ SOLN
INTRAMUSCULAR | Status: DC | PRN
  Administered 2012-12-20 (×4): 50 ug via INTRAVENOUS

## 2012-12-20 MED ORDER — DOXYCYCLINE HYCLATE 100 MG PO TABS: 100.0000 mg | ORAL_TABLET | Freq: Two times a day (BID) | ORAL | Status: AC

## 2012-12-20 MED ORDER — LIDOCAINE-EPINEPHRINE (PF) 2 %-1:200000 IJ SOLN
INTRAMUSCULAR | Status: DC | PRN
  Administered 2012-12-20: 10 mL via PERINEURAL

## 2012-12-20 MED ORDER — MEPERIDINE HCL 25 MG/ML IJ SOLN: 6.2500 mg | INTRAMUSCULAR | Status: DC | PRN

## 2012-12-20 MED ORDER — SODIUM CHLORIDE 0.9 % IR SOLN
Status: DC | PRN
  Administered 2012-12-20: 1 via INTRAVESICAL

## 2012-12-20 MED ORDER — LIDOCAINE HCL (CARDIAC) 20 MG/ML IV SOLN
INTRAVENOUS | Status: DC | PRN
  Administered 2012-12-20: 50 mg via INTRAVENOUS

## 2012-12-20 MED ORDER — ONDANSETRON HCL 4 MG/2ML IJ SOLN: 4.0000 mg | Freq: Once | INTRAMUSCULAR | Status: DC | PRN

## 2012-12-20 MED ORDER — KETOROLAC TROMETHAMINE 30 MG/ML IJ SOLN: 15.0000 mg | Freq: Once | INTRAMUSCULAR | Status: DC | PRN

## 2012-12-20 MED ORDER — LIDOCAINE HCL (CARDIAC) 20 MG/ML IV SOLN
INTRAVENOUS | Status: AC
  Filled 2012-12-20: qty 5

## 2012-12-20 MED ORDER — ONDANSETRON HCL 4 MG/2ML IJ SOLN
INTRAMUSCULAR | Status: AC
  Filled 2012-12-20: qty 2

## 2012-12-20 MED ORDER — LIDOCAINE-EPINEPHRINE (PF) 2 %-1:200000 IJ SOLN
INTRAMUSCULAR | Status: AC
  Filled 2012-12-20: qty 20

## 2012-12-20 MED ORDER — FENTANYL CITRATE 0.05 MG/ML IJ SOLN: 25.0000 ug | INTRAMUSCULAR | Status: DC | PRN

## 2012-12-20 MED ORDER — PROPOFOL 10 MG/ML IV BOLUS
INTRAVENOUS | Status: DC | PRN
  Administered 2012-12-20: 200 mg via INTRAVENOUS

## 2012-12-20 MED ORDER — ONDANSETRON HCL 4 MG/2ML IJ SOLN
INTRAMUSCULAR | Status: DC | PRN
  Administered 2012-12-20: 4 mg via INTRAVENOUS

## 2012-12-20 MED ORDER — GLYCOPYRROLATE 0.2 MG/ML IJ SOLN
INTRAMUSCULAR | Status: DC | PRN
  Administered 2012-12-20: 0.2 mg via INTRAVENOUS

## 2012-12-20 MED ORDER — LACTATED RINGERS IV SOLN: INTRAVENOUS | Status: DC

## 2012-12-20 MED ORDER — HYDROCODONE-ACETAMINOPHEN 5-300 MG PO TABS: ORAL_TABLET | ORAL | Status: DC

## 2012-12-20 MED ORDER — IBUPROFEN 600 MG PO TABS: ORAL_TABLET | ORAL | Status: DC

## 2012-12-20 SURGICAL SUPPLY — 26 items
BOOTIES KNEE HIGH SLOAN (MISCELLANEOUS) ×4 IMPLANT
CANISTER SUCT 3000ML (MISCELLANEOUS) ×3 IMPLANT
CATH ROBINSON RED A/P 16FR (CATHETERS) ×3 IMPLANT
CLOTH BEACON ORANGE TIMEOUT ST (SAFETY) ×3 IMPLANT
CONTAINER PREFILL 10% NBF 60ML (FORM) ×6 IMPLANT
CORD ACTIVE DISPOSABLE (ELECTRODE) ×1
CORD ELECTRO ACTIVE DISP (ELECTRODE) ×2 IMPLANT
DILATOR CANAL MILEX (MISCELLANEOUS) IMPLANT
DRESSING TELFA 8X3 (GAUZE/BANDAGES/DRESSINGS) ×3 IMPLANT
ELECT LOOP GYNE PRO 24FR (CUTTING LOOP)
ELECT REM PT RETURN 9FT ADLT (ELECTROSURGICAL) ×3
ELECT VAPORTRODE GRVD BAR (ELECTRODE) IMPLANT
ELECTRODE LOOP GYNE PRO 24FR (CUTTING LOOP) IMPLANT
ELECTRODE REM PT RTRN 9FT ADLT (ELECTROSURGICAL) ×2 IMPLANT
ELECTRODE ROLLER VERSAPOINT (ELECTRODE) IMPLANT
ELECTRODE RT ANGLE VERSAPOINT (CUTTING LOOP) ×1 IMPLANT
ELECTRODE TWIZZLE TIP (MISCELLANEOUS) IMPLANT
GLOVE SURG SS PI 6.5 STRL IVOR (GLOVE) ×6 IMPLANT
GOWN STRL REIN XL XLG (GOWN DISPOSABLE) ×9 IMPLANT
LOOP ANGLED CUTTING 22FR (CUTTING LOOP) IMPLANT
PACK HYSTEROSCOPY LF (CUSTOM PROCEDURE TRAY) ×3 IMPLANT
PAD OB MATERNITY 4.3X12.25 (PERSONAL CARE ITEMS) ×3 IMPLANT
SUT VIC AB 3-0 SH 27 (SUTURE) ×3
SUT VIC AB 3-0 SH 27X BRD (SUTURE) IMPLANT
TOWEL OR 17X24 6PK STRL BLUE (TOWEL DISPOSABLE) ×6 IMPLANT
WATER STERILE IRR 1000ML POUR (IV SOLUTION) ×3 IMPLANT

## 2012-12-20 NOTE — Op Note (Signed)
12/20/2012  10:56 AM  PATIENT:  Julie [name]  37 y.o. female  PRE-OPERATIVE DIAGNOSIS:  Endometrial mass, Irregular Bleeding, Abnormal Uterine Bleeding; vulvar irritation  POST-OPERATIVE DIAGNOSIS:  Endometrial mass, Irregular Bleeding, Abnormal Uterine Bleeding; vulvar irritation ;Submucosal fibroid  PROCEDURE:  Procedure(s): DILATATION & CURETTAGE/HYSTEROSCOPY WITH VERSAPOINT RESECTION Vulvar biopsy  SURGEON:  Jamaya Sleeth P, MD  ASSISTANTS: none  ANESTHESIA:   general  ESTIMATED BLOOD LOSS:  Less than 25 cc  BLOOD ADMINISTERED:none  COMPLICATIONS: None  FINDINGS: The uterus sounded to 7 cm. At the time of hysteroscopy a less than 2 cm anterior submucosal myoma was noted. The Mirena IUD, which had been placed in June was likewise noted, however, both the strings were with in the uterine cavity.  FLUID DEFICIT:270cc  LOCAL MEDICATIONS USED:  LIDOCAINE  and Amount: 20 ml total. 10 cc of lidocaine 2% was used for paracervical block. 10 cc of 2% Xylocaine with epinephrine was used for the subcutaneous infiltration for the vulvar biopsy  SPECIMEN:  1) intracavitary lesion probable submucosal fibroid            2) vulvar biopsy  DISPOSITION OF SPECIMEN:  PATHOLOGY  COUNTS:  YES  DESCRIPTION OF PROCEDURE:the patient was taken to the operating room after appropriate identification and placed on the operating table. After the attainment of adequate general anesthesia she was placed in the lithotomy position. The perineum and vagina were prepped with multiple layers of Betadine. The bladder was emptied with a an in and out catheter. The perineum was draped in sterile field. A gray speculum was placed in the vagina. The cervix was grasped with a single-tooth tenaculum. A paracervical block was achieved with a total of 10 cc of 2% Xylocaine and the 5 and 7:00 positions. The uterus was sounded to 7 cm.  The cervix was then dilated to accommodate the diagnostic hysteroscope. The  hysteroscope was used to evaluate all quadrants of the uterus. The aforementioned anterior submucosal myoma was noted. The VersaPoint resection, system was then used to resect the myoma and bring as much of the specimen out of the uterine cavity as possible. A set of graspers was then placed through the operative port of the hysteroscope and used to bring the strings of the IUD through the cervix so that they could be easily visualized. The hysteroscope was then removed from the uterine cavity and the speculum removed from the vagina. An area on the fall by at approximately 4:00 position near the perineum was then infiltrated with 10 cc of 2% Xylocaine with epinephrine. A biopsy measuring approximately 5 mm was then taken. The defect was closed with a figure-of-eight suture of 3-0 Vicryl. With adequate hemostasis. The patient was awakened from general anesthesia and taken to the recovery room in satisfactory condition having tolerated the procedure well sponge and instrument counts correct.  PLAN OF CARE: Discharge home after postanesthesia care  PATIENT DISPOSITION:  PACU - hemodynamically stable.   Delay start of Pharmacological VTE agent (>24hrs) due to surgical blood loss or risk of bleeding:  Yes.  Early ambulation is encouraged.   Hal Morales, MD 10:56 AM

## 2012-12-20 NOTE — Transfer of Care (Signed)
Immediate Anesthesia Transfer of Care Note  Patient: Julie [name]  Procedure(s) Performed: Procedure(s): DILATATION & CURETTAGE/HYSTEROSCOPY WITH VERSAPOINT RESECTION (N/A)  Patient Location: PACU  Anesthesia Type:General  Level of Consciousness: awake, alert  and oriented  Airway & Oxygen Therapy: Patient Spontanous Breathing and Patient connected to nasal cannula oxygen  Post-op Assessment: Report given to PACU RN and Post -op Vital signs reviewed and stable  Post vital signs: Reviewed and stable  Complications: No apparent anesthesia complications

## 2012-12-20 NOTE — Anesthesia Postprocedure Evaluation (Signed)
Anesthesia Post Note  Patient: Julie [name]  Procedure(s) Performed: Procedure(s) (LRB): DILATATION & CURETTAGE/HYSTEROSCOPY WITH VERSAPOINT RESECTION (N/A) VULVAR BIOPSY (Left)  Anesthesia type: General  Patient location: PACU  Post pain: Pain level controlled  Post assessment: Post-op Vital signs reviewed  Last Vitals:  Filed Vitals:   12/20/12 1045  BP: 131/74  Pulse: 81  Temp:   Resp: 15    Post vital signs: Reviewed  Level of consciousness: sedated  Complications: No apparent anesthesia complications

## 2012-12-20 NOTE — Anesthesia Preprocedure Evaluation (Signed)
Anesthesia Evaluation  Patient identified by MRN, date of birth, ID band Patient awake    Reviewed: Allergy & Precautions, H&P , Patient's Chart, lab work & pertinent test results  Airway Mallampati: II TM Distance: >3 FB Neck ROM: full    Dental no notable dental hx. (+) Teeth Intact   Pulmonary neg pulmonary ROS,          Cardiovascular negative cardio ROS      Neuro/Psych negative psych ROS   GI/Hepatic negative GI ROS, Neg liver ROS,   Endo/Other  negative endocrine ROS  Renal/GU negative Renal ROS     Musculoskeletal   Abdominal Normal abdominal exam  (+)   Peds  Hematology   Anesthesia Other Findings   Reproductive/Obstetrics negative OB ROS                           Anesthesia Physical Anesthesia Plan  ASA: II  Anesthesia Plan: General   Post-op Pain Management:    Induction: Intravenous  Airway Management Planned: LMA  Additional Equipment:   Intra-op Plan:   Post-operative Plan:   Informed Consent: I have reviewed the patients History and Physical, chart, labs and discussed the procedure including the risks, benefits and alternatives for the proposed anesthesia with the patient or authorized representative who has indicated his/her understanding and acceptance.     Plan Discussed with: CRNA and Surgeon  Anesthesia Plan Comments:         Anesthesia Quick Evaluation

## 2012-12-23 ENCOUNTER — Encounter (HOSPITAL_COMMUNITY): Payer: Self-pay | Admitting: Obstetrics and Gynecology

## 2013-08-28 ENCOUNTER — Other Ambulatory Visit: Payer: Self-pay | Admitting: Obstetrics and Gynecology

## 2013-11-17 ENCOUNTER — Encounter (HOSPITAL_COMMUNITY): Payer: Self-pay | Admitting: Obstetrics and Gynecology

## 2014-09-22 ENCOUNTER — Other Ambulatory Visit: Payer: Self-pay | Admitting: Obstetrics and Gynecology

## 2014-11-13 ENCOUNTER — Other Ambulatory Visit: Payer: Self-pay | Admitting: Obstetrics and Gynecology

## 2014-11-20 NOTE — Patient Instructions (Signed)
Your procedure is scheduled on:  Thursday, NOV. 17, 2016  Enter through the Main Entrance of [facility] at:  7:30 AM  Pick up the phone at the desk and dial [phone].  Call this number if you have problems the morning of surgery: [phone].  Remember: Do NOT eat food or drink after: Midnight Wednesday , Nov. 16, 2016  Take these medicines the morning of surgery with a SIP OF WATER:  none  Do NOT wear jewelry (body piercing), metal hair clips/bobby pins, make-up, or nail polish. Do NOT wear lotions, powders, or perfumes.  You may wear deoderant. Do NOT shave for 48 hours prior to surgery. Do NOT bring valuables to the hospital. Contacts, dentures, or bridgework may not be worn into surgery. Leave suitcase in car.  After surgery it may be brought to your room.  For patients admitted to the hospital, checkout time is 11:00 AM the day of discharge.

## 2014-11-23 ENCOUNTER — Encounter (HOSPITAL_COMMUNITY): Payer: Self-pay

## 2014-11-23 ENCOUNTER — Encounter (HOSPITAL_COMMUNITY)
Admission: RE | Admit: 2014-11-23 | Discharge: 2014-11-23 | Disposition: A | Discharge status: Home / Self Care | Payer: BC Managed Care – PPO | Source: Ambulatory Visit | Attending: Obstetrics and Gynecology | Admitting: Obstetrics and Gynecology

## 2014-11-23 ENCOUNTER — Other Ambulatory Visit (HOSPITAL_COMMUNITY): Payer: Self-pay | Admitting: Obstetrics and Gynecology

## 2014-11-23 DIAGNOSIS — N83209 Unspecified ovarian cyst, unspecified side: Secondary | ICD-10-CM | POA: Diagnosis not present

## 2014-11-23 DIAGNOSIS — N926 Irregular menstruation, unspecified: Secondary | ICD-10-CM | POA: Diagnosis not present

## 2014-11-23 DIAGNOSIS — Z01818 Encounter for other preprocedural examination: Secondary | ICD-10-CM | POA: Insufficient documentation

## 2014-11-23 DIAGNOSIS — N946 Dysmenorrhea, unspecified: Secondary | ICD-10-CM | POA: Diagnosis not present

## 2014-11-23 LAB — CBC
HEMATOCRIT: 35.3 % — AB (ref 36.0–46.0)
Hemoglobin: 11.7 g/dL — ABNORMAL LOW (ref 12.0–15.0)
MCH: 31.2 pg (ref 26.0–34.0)
MCHC: 33.1 g/dL (ref 30.0–36.0)
MCV: 94.1 fL (ref 78.0–100.0)
PLATELETS: 275 10*3/uL (ref 150–400)
RBC: 3.75 MIL/uL — AB (ref 3.87–5.11)
RDW: 14.9 % (ref 11.5–15.5)
WBC: 5.8 10*3/uL (ref 4.0–10.5)

## 2014-11-23 NOTE — H&P (Signed)
Julie [name] is a 38 y.o.  female P: 5-0-0-5 who  presents for hysterectomy because of a long standing history of irregular bleeding.  For over 3 years the patient has had irregular bleeding that over the past year has become almost daily.  In spite of a Mirena IUD,  placed in 2014,  the patient has had  bleeding ranging from spotting to having to change her pad every hour most days of the week.   One month,  in this past year,  she had to change her pad every 30 minutes to an hour.  Over the past few months however,   she has had an  "almost" monthly regular menstrual flow (7 day flow with pad change every 2 or more hours), since being placed on daily estradiol 2 mg.  She admits to positional dyspareunia, occasional lower back discomfort and bleeding with orgasm.  She denies changes in her bowel or bladder function or fever.  A pelvic ultrasound in June 2016 showed:   uterus: 5.43 x 7.67 x 5.96 cm, endometrium-3.91 mm and IUD properly placed within uterine cavity by 3D imaging;     #5 uterine fibroids:   fundal intramural 2.6 x 1.75 x 1.74 cm;   anterior right intramural-2.03 x 1.27 x 1.48 cm;   anterior left intramural-2.0 x 0.9 x 1.79 cm;     posterior left sub-serosal-5.17 x 4.34 x 5.12 cm  and   an anterior midline intramural-1.69 x 1.53 x 1.85 cm;  right ovary-3.39 x 2.08 x 1.35 cm and   left ovary-2.60 x 1.74 x 1.46 cm.    A CBC and TSH done during the course of her evaluation were both  normal.  A review of medical and surgical management options were given to the patient for consideration.   She has decided that  given the protracted and disruptive nature of her symptoms, she will  proceed with definitive therapy in the form of hysterectomy. 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  This adds to her desire for definitive therapy.  Past Medical History  OB History: G: 4;   P:  5-0-0-5;  SVB: 1996, 1998 and 2001  (largest infant- 9 lbs. 2 oz).;  C-section 2000 (twins)    GYN History: menarche: 39 YO    LMP: 11/10/14    Contracepton IUD  The patient reports a past history of: chlamydia and HPV.  Has a  history of abnormal PAP smear that was treated with Cryotherapy in 1994;   Last PAP smear: 2016  ASCUS with positive HPV (pathology consistent with LSIL)    Medical History: Uterine Fibroids,   Alopecia, Hirsutism,    Menstrual Migraines,   Anemia,   Right Breast Fibroadenoma,   Vitamin D Deficiency,   Heart Murmur (as a child),  Tuberculosis 2001  Surgical History: 1992 & 1994-Right Breast Fibroadenoma Removal;   1994 Cryotherapy of the Cervix;         2008 Hysteroscopy D & C-Endometrial Polypectomy   and   2014 Hysteroscopy with Versapoint, D & C and Vulvar Biopsy  (benign)  Denies problems with anesthesia or history of blood transfusions  Family History: Diabetes Mellitus, Hypertension, Stroke and Throat Cancer  Social History: Divorced and 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   ROS: Admits to glasses, menstrual headaches,  but denies  vision changes, nasal congestion, dysphagia, tinnitus, dizziness, hoarseness, cough,  chest pain, shortness of breath, nausea, vomiting, diarrhea,constipation,  urinary frequency, urgency  dysuria, hematuria, vaginitis symptoms, pelvic pain, swelling of joints,easy bruising,  myalgias, arthralgias, skin rashes, unexplained weight loss and except as is mentioned in the history of present illness, patient's review of systems is otherwise negative.   Physical Exam  Bp: 138/88      P: 72   R: 18   Temperature: 98.1 degrees F orally     Weight: 189.25 lbs.  Height:  5\' 4"    BMI: 32.5  Neck:  supple without masses or thyromegaly Lungs: clear to auscultation Heart: regular rate and rhythm Abdomen: soft, non-tender and no organomegaly Pelvic:EGBUS- wnl; vagina-normal rugae; uterus-irregular, 10-12 weeks size, cervix without lesions or motion tenderness; adnexae-no tenderness or masses Extremities:  no clubbing, cyanosis or edema   Assesment: Irregular Uterine Bleeding           Uterine Fibroids           History of Abnormal PAP Smear (CIN-1)  Disposition:  A discussion was held with patient regarding the indication for her procedure(s) along with the risks, which include but are not limited to: reaction to anesthesia, damage to adjacent organs, infection,  excessive bleeding, pelvic prolapse and the possible need for an open abdominal incision.  The patient verbalized understanding of these risks and has consented to proceed with a Total Vaginal Hysterectomy, Bilateral Salpingectomy and Possible Abdominal Hysterectomy at [place] on December 03, 2014 at 9 a.m.   CSN# [id number]   Ilamae Geng J. Florene Glen, PA-C  for Dr. Seymour Bars. Haygood

## 2014-12-02 MED ORDER — CEFOTETAN DISODIUM 2 G IJ SOLR
2.0000 g | INTRAMUSCULAR | Status: AC
  Administered 2014-12-03: 2 g via INTRAVENOUS
  Filled 2014-12-02 (×2): qty 2

## 2014-12-03 ENCOUNTER — Observation Stay (HOSPITAL_COMMUNITY)
Admission: RE | Admit: 2014-12-03 | Discharge: 2014-12-04 | Disposition: A | Discharge status: Home / Self Care | Payer: BC Managed Care – PPO | Source: Ambulatory Visit | Attending: Obstetrics and Gynecology | Admitting: Obstetrics and Gynecology

## 2014-12-03 ENCOUNTER — Ambulatory Visit (HOSPITAL_COMMUNITY): Payer: BC Managed Care – PPO | Admitting: Certified Registered Nurse Anesthetist

## 2014-12-03 ENCOUNTER — Encounter (HOSPITAL_COMMUNITY)
Admission: RE | Disposition: A | Discharge status: Home / Self Care | Payer: Self-pay | Source: Ambulatory Visit | Attending: Obstetrics and Gynecology

## 2014-12-03 ENCOUNTER — Encounter (HOSPITAL_COMMUNITY): Payer: Self-pay | Admitting: *Deleted

## 2014-12-03 DIAGNOSIS — D259 Leiomyoma of uterus, unspecified: Secondary | ICD-10-CM | POA: Insufficient documentation

## 2014-12-03 DIAGNOSIS — N938 Other specified abnormal uterine and vaginal bleeding: Secondary | ICD-10-CM | POA: Diagnosis present

## 2014-12-03 DIAGNOSIS — N926 Irregular menstruation, unspecified: Secondary | ICD-10-CM | POA: Diagnosis not present

## 2014-12-03 DIAGNOSIS — N8311 Corpus luteum cyst of right ovary: Secondary | ICD-10-CM | POA: Insufficient documentation

## 2014-12-03 DIAGNOSIS — Z90710 Acquired absence of both cervix and uterus: Secondary | ICD-10-CM | POA: Insufficient documentation

## 2014-12-03 DIAGNOSIS — N946 Dysmenorrhea, unspecified: Secondary | ICD-10-CM | POA: Diagnosis not present

## 2014-12-03 DIAGNOSIS — D250 Submucous leiomyoma of uterus: Secondary | ICD-10-CM | POA: Diagnosis present

## 2014-12-03 DIAGNOSIS — D500 Iron deficiency anemia secondary to blood loss (chronic): Secondary | ICD-10-CM | POA: Diagnosis present

## 2014-12-03 DIAGNOSIS — N939 Abnormal uterine and vaginal bleeding, unspecified: Secondary | ICD-10-CM | POA: Diagnosis not present

## 2014-12-03 DIAGNOSIS — Z975 Presence of (intrauterine) contraceptive device: Secondary | ICD-10-CM | POA: Diagnosis not present

## 2014-12-03 DIAGNOSIS — N920 Excessive and frequent menstruation with regular cycle: Secondary | ICD-10-CM | POA: Diagnosis present

## 2014-12-03 HISTORY — PX: VAGINAL HYSTERECTOMY: SHX2639

## 2014-12-03 LAB — PREGNANCY, URINE: PREG TEST UR: NEGATIVE

## 2014-12-03 SURGERY — HYSTERECTOMY, VAGINAL
Anesthesia: General | Laterality: Bilateral

## 2014-12-03 MED ORDER — SODIUM CHLORIDE 0.9 % IJ SOLN
INTRAMUSCULAR | Status: AC
  Filled 2014-12-03: qty 50

## 2014-12-03 MED ORDER — KETOROLAC TROMETHAMINE 30 MG/ML IJ SOLN
INTRAMUSCULAR | Status: DC | PRN
  Administered 2014-12-03: 30 mg via INTRAVENOUS

## 2014-12-03 MED ORDER — HYDROMORPHONE HCL 1 MG/ML IJ SOLN
INTRAMUSCULAR | Status: AC
  Filled 2014-12-03: qty 1

## 2014-12-03 MED ORDER — DEXAMETHASONE SODIUM PHOSPHATE 4 MG/ML IJ SOLN
INTRAMUSCULAR | Status: AC
  Filled 2014-12-03: qty 1

## 2014-12-03 MED ORDER — ONDANSETRON HCL 4 MG/2ML IJ SOLN
INTRAMUSCULAR | Status: AC
  Filled 2014-12-03: qty 2

## 2014-12-03 MED ORDER — FENTANYL CITRATE (PF) 250 MCG/5ML IJ SOLN
INTRAMUSCULAR | Status: AC
  Filled 2014-12-03: qty 5

## 2014-12-03 MED ORDER — SCOPOLAMINE 1 MG/3DAYS TD PT72
1.0000 | MEDICATED_PATCH | Freq: Once | TRANSDERMAL | Status: DC
  Administered 2014-12-03: 1.5 mg via TRANSDERMAL

## 2014-12-03 MED ORDER — OXYCODONE-ACETAMINOPHEN 5-325 MG PO TABS
1.0000 | ORAL_TABLET | ORAL | Status: DC | PRN
  Administered 2014-12-04: 1 via ORAL
  Administered 2014-12-04: 2 via ORAL
  Filled 2014-12-03: qty 2
  Filled 2014-12-03: qty 1

## 2014-12-03 MED ORDER — LACTATED RINGERS IV SOLN
INTRAVENOUS | Status: DC
  Administered 2014-12-03 (×2): via INTRAVENOUS

## 2014-12-03 MED ORDER — NEOSTIGMINE METHYLSULFATE 10 MG/10ML IV SOLN
INTRAVENOUS | Status: AC
  Filled 2014-12-03: qty 1

## 2014-12-03 MED ORDER — NEOSTIGMINE METHYLSULFATE 10 MG/10ML IV SOLN
INTRAVENOUS | Status: DC | PRN
  Administered 2014-12-03: 2 mg via INTRAVENOUS

## 2014-12-03 MED ORDER — MEPERIDINE HCL 25 MG/ML IJ SOLN: 6.2500 mg | INTRAMUSCULAR | Status: DC | PRN

## 2014-12-03 MED ORDER — ROCURONIUM BROMIDE 100 MG/10ML IV SOLN
INTRAVENOUS | Status: DC | PRN
  Administered 2014-12-03: 60 mg via INTRAVENOUS

## 2014-12-03 MED ORDER — LABETALOL HCL 5 MG/ML IV SOLN
INTRAVENOUS | Status: AC
  Administered 2014-12-03: 5 mg via INTRAVENOUS
  Filled 2014-12-03: qty 4

## 2014-12-03 MED ORDER — LABETALOL HCL 5 MG/ML IV SOLN
5.0000 mg | INTRAVENOUS | Status: DC | PRN
  Administered 2014-12-03 (×3): 5 mg via INTRAVENOUS

## 2014-12-03 MED ORDER — ONDANSETRON HCL 4 MG/2ML IJ SOLN: 4.0000 mg | Freq: Four times a day (QID) | INTRAMUSCULAR | Status: DC | PRN

## 2014-12-03 MED ORDER — NALOXONE HCL 0.4 MG/ML IJ SOLN: 0.4000 mg | INTRAMUSCULAR | Status: DC | PRN

## 2014-12-03 MED ORDER — HYDROMORPHONE HCL 1 MG/ML IJ SOLN: 0.2500 mg | INTRAMUSCULAR | Status: DC | PRN

## 2014-12-03 MED ORDER — LABETALOL HCL 5 MG/ML IV SOLN
5.0000 mg | Freq: Once | INTRAVENOUS | Status: AC
  Administered 2014-12-03: 5 mg via INTRAVENOUS

## 2014-12-03 MED ORDER — FENTANYL CITRATE (PF) 100 MCG/2ML IJ SOLN
INTRAMUSCULAR | Status: DC | PRN
  Administered 2014-12-03 (×5): 50 ug via INTRAVENOUS
  Administered 2014-12-03: 100 ug via INTRAVENOUS
  Administered 2014-12-03 (×3): 50 ug via INTRAVENOUS

## 2014-12-03 MED ORDER — MIDAZOLAM HCL 2 MG/2ML IJ SOLN
INTRAMUSCULAR | Status: DC | PRN
  Administered 2014-12-03: 2 mg via INTRAVENOUS

## 2014-12-03 MED ORDER — LACTATED RINGERS IV SOLN
INTRAVENOUS | Status: DC
  Administered 2014-12-03 (×2): via INTRAVENOUS

## 2014-12-03 MED ORDER — GLYCOPYRROLATE 0.2 MG/ML IJ SOLN
INTRAMUSCULAR | Status: AC
  Filled 2014-12-03: qty 3

## 2014-12-03 MED ORDER — PROPOFOL 10 MG/ML IV BOLUS
INTRAVENOUS | Status: AC
  Filled 2014-12-03: qty 20

## 2014-12-03 MED ORDER — KETOROLAC TROMETHAMINE 30 MG/ML IJ SOLN
30.0000 mg | Freq: Four times a day (QID) | INTRAMUSCULAR | Status: AC
  Administered 2014-12-03 – 2014-12-04 (×3): 30 mg via INTRAVENOUS
  Filled 2014-12-03 (×3): qty 1

## 2014-12-03 MED ORDER — DIPHENHYDRAMINE HCL 50 MG/ML IJ SOLN: 12.5000 mg | Freq: Four times a day (QID) | INTRAMUSCULAR | Status: DC | PRN

## 2014-12-03 MED ORDER — PROPOFOL 10 MG/ML IV BOLUS
INTRAVENOUS | Status: DC | PRN
  Administered 2014-12-03: 180 mg via INTRAVENOUS

## 2014-12-03 MED ORDER — PROMETHAZINE HCL 25 MG/ML IJ SOLN: 6.2500 mg | INTRAMUSCULAR | Status: DC | PRN

## 2014-12-03 MED ORDER — LIDOCAINE HCL (CARDIAC) 20 MG/ML IV SOLN
INTRAVENOUS | Status: AC
  Filled 2014-12-03: qty 5

## 2014-12-03 MED ORDER — SCOPOLAMINE 1 MG/3DAYS TD PT72
MEDICATED_PATCH | TRANSDERMAL | Status: AC
  Administered 2014-12-03: 1.5 mg via TRANSDERMAL
  Filled 2014-12-03: qty 1

## 2014-12-03 MED ORDER — ESTRADIOL 0.1 MG/GM VA CREA
TOPICAL_CREAM | VAGINAL | Status: AC
  Filled 2014-12-03: qty 42.5

## 2014-12-03 MED ORDER — SODIUM CHLORIDE 0.9 % IJ SOLN: 9.0000 mL | INTRAMUSCULAR | Status: DC | PRN

## 2014-12-03 MED ORDER — BUPIVACAINE HCL (PF) 0.25 % IJ SOLN
INTRAMUSCULAR | Status: AC
  Filled 2014-12-03: qty 10

## 2014-12-03 MED ORDER — ONDANSETRON HCL 4 MG/2ML IJ SOLN
INTRAMUSCULAR | Status: DC | PRN
  Administered 2014-12-03: 4 mg via INTRAVENOUS

## 2014-12-03 MED ORDER — DEXAMETHASONE SODIUM PHOSPHATE 10 MG/ML IJ SOLN
INTRAMUSCULAR | Status: DC | PRN
  Administered 2014-12-03: 4 mg via INTRAVENOUS

## 2014-12-03 MED ORDER — ONDANSETRON HCL 4 MG PO TABS: 4.0000 mg | ORAL_TABLET | Freq: Three times a day (TID) | ORAL | Status: DC | PRN

## 2014-12-03 MED ORDER — IBUPROFEN 600 MG PO TABS
600.0000 mg | ORAL_TABLET | Freq: Four times a day (QID) | ORAL | Status: DC | PRN
  Administered 2014-12-04: 600 mg via ORAL
  Filled 2014-12-03: qty 1

## 2014-12-03 MED ORDER — LACTATED RINGERS IV SOLN: INTRAVENOUS | Status: DC

## 2014-12-03 MED ORDER — LIDOCAINE HCL (CARDIAC) 20 MG/ML IV SOLN
INTRAVENOUS | Status: DC | PRN
  Administered 2014-12-03: 80 mg via INTRAVENOUS

## 2014-12-03 MED ORDER — HEPARIN SODIUM (PORCINE) 5000 UNIT/ML IJ SOLN
INTRAMUSCULAR | Status: AC
  Filled 2014-12-03: qty 1

## 2014-12-03 MED ORDER — ESTRADIOL 0.1 MG/GM VA CREA
TOPICAL_CREAM | VAGINAL | Status: DC | PRN
  Administered 2014-12-03: 1 via VAGINAL

## 2014-12-03 MED ORDER — VASOPRESSIN 20 UNIT/ML IV SOLN
INTRAVENOUS | Status: AC
  Filled 2014-12-03: qty 1

## 2014-12-03 MED ORDER — GLYCOPYRROLATE 0.2 MG/ML IJ SOLN
INTRAMUSCULAR | Status: DC | PRN
  Administered 2014-12-03: 0.3 mg via INTRAVENOUS

## 2014-12-03 MED ORDER — HYDROMORPHONE 1 MG/ML IV SOLN
INTRAVENOUS | Status: DC
  Administered 2014-12-03: 15:00:00 via INTRAVENOUS
  Administered 2014-12-03: 2.1 mg via INTRAVENOUS
  Administered 2014-12-03: 2.7 mg via INTRAVENOUS
  Administered 2014-12-04: 1.6 mg via INTRAVENOUS
  Filled 2014-12-03: qty 25

## 2014-12-03 MED ORDER — MENTHOL 3 MG MT LOZG
1.0000 | LOZENGE | OROMUCOSAL | Status: DC | PRN
  Administered 2014-12-03: 3 mg via ORAL
  Filled 2014-12-03: qty 9

## 2014-12-03 MED ORDER — VASOPRESSIN 20 UNIT/ML IV SOLN
INTRAVENOUS | Status: DC | PRN
  Administered 2014-12-03: 40 mL via INTRAMUSCULAR

## 2014-12-03 MED ORDER — MIDAZOLAM HCL 2 MG/2ML IJ SOLN
INTRAMUSCULAR | Status: AC
  Filled 2014-12-03: qty 2

## 2014-12-03 MED ORDER — HYDROMORPHONE HCL 1 MG/ML IJ SOLN
INTRAMUSCULAR | Status: DC | PRN
  Administered 2014-12-03: 1 mg via INTRAVENOUS

## 2014-12-03 MED ORDER — DIPHENHYDRAMINE HCL 12.5 MG/5ML PO ELIX: 12.5000 mg | ORAL_SOLUTION | Freq: Four times a day (QID) | ORAL | Status: DC | PRN

## 2014-12-03 MED ORDER — KETOROLAC TROMETHAMINE 30 MG/ML IJ SOLN
INTRAMUSCULAR | Status: AC
  Filled 2014-12-03: qty 1

## 2014-12-03 MED ORDER — ROCURONIUM BROMIDE 100 MG/10ML IV SOLN
INTRAVENOUS | Status: AC
  Filled 2014-12-03: qty 1

## 2014-12-03 SURGICAL SUPPLY — 61 items
CANISTER SUCT 3000ML (MISCELLANEOUS) ×3 IMPLANT
CATH ROBINSON RED A/P 16FR (CATHETERS) IMPLANT
CLOTH BEACON ORANGE TIMEOUT ST (SAFETY) ×3 IMPLANT
CONT PATH 16OZ SNAP LID 3702 (MISCELLANEOUS) ×3 IMPLANT
CONT SPECI 4OZ STER CLIK (MISCELLANEOUS) IMPLANT
DECANTER SPIKE VIAL GLASS SM (MISCELLANEOUS) ×4 IMPLANT
DRAIN JACKSON PRT FLT 7MM (DRAIN) IMPLANT
DRAPE SHEET LG 3/4 BI-LAMINATE (DRAPES) ×4 IMPLANT
DRAPE WARM FLUID 44X44 (DRAPE) IMPLANT
DRSG OPSITE POSTOP 4X10 (GAUZE/BANDAGES/DRESSINGS) ×1 IMPLANT
DURAPREP 26ML APPLICATOR (WOUND CARE) ×3 IMPLANT
ELECT CAUTERY BLADE 6.4 (BLADE) IMPLANT
ELECT NDL TIP 2.8 STRL (NEEDLE) IMPLANT
ELECT NEEDLE TIP 2.8 STRL (NEEDLE) IMPLANT
EVACUATOR SILICONE 100CC (DRAIN) IMPLANT
GAUZE PACKING 2X5 YD STRL (GAUZE/BANDAGES/DRESSINGS) ×2 IMPLANT
GAUZE SPONGE 4X4 16PLY XRAY LF (GAUZE/BANDAGES/DRESSINGS) IMPLANT
GLOVE BIO SURGEON STRL SZ7 (GLOVE) ×4 IMPLANT
GLOVE BIO SURGEON STRL SZ8 (GLOVE) ×4 IMPLANT
GLOVE BIOGEL PI IND STRL 6.5 (GLOVE) ×1 IMPLANT
GLOVE BIOGEL PI IND STRL 7.0 (GLOVE) ×1 IMPLANT
GLOVE BIOGEL PI INDICATOR 6.5 (GLOVE) ×2
GLOVE BIOGEL PI INDICATOR 7.0 (GLOVE) ×2
GLOVE SURG SS PI 6.5 STRL IVOR (GLOVE) ×6 IMPLANT
GOWN STRL REUS W/TWL LRG LVL3 (GOWN DISPOSABLE) ×12 IMPLANT
LIQUID BAND (GAUZE/BANDAGES/DRESSINGS) IMPLANT
NDL MAYO CATGUT SZ4 TPR NDL (NEEDLE) ×1 IMPLANT
NDL SPNL 22GX3.5 QUINCKE BK (NEEDLE) ×1 IMPLANT
NEEDLE HYPO 22GX1.5 SAFETY (NEEDLE) IMPLANT
NEEDLE MAYO CATGUT SZ4 (NEEDLE) ×3 IMPLANT
NEEDLE SPNL 22GX3.5 QUINCKE BK (NEEDLE) ×3 IMPLANT
NS IRRIG 1000ML POUR BTL (IV SOLUTION) ×3 IMPLANT
PACK ABDOMINAL GYN (CUSTOM PROCEDURE TRAY) ×3 IMPLANT
PACK VAGINAL WOMENS (CUSTOM PROCEDURE TRAY) ×3 IMPLANT
PAD MAGNETIC INST (MISCELLANEOUS) ×3 IMPLANT
PAD OB MATERNITY 4.3X12.25 (PERSONAL CARE ITEMS) ×3 IMPLANT
PENCIL SMOKE EVAC W/HOLSTER (ELECTROSURGICAL) ×1 IMPLANT
PROTECTOR NERVE ULNAR (MISCELLANEOUS) ×1 IMPLANT
SPONGE LAP 18X18 X RAY DECT (DISPOSABLE) ×6 IMPLANT
STAPLER VISISTAT 35W (STAPLE) IMPLANT
SUT CHROMIC 2 0 TIES 18 (SUTURE) IMPLANT
SUT MNCRL AB 3-0 PS2 27 (SUTURE) IMPLANT
SUT PDS AB 1 CT  36 (SUTURE)
SUT PDS AB 1 CT 36 (SUTURE) IMPLANT
SUT PLAIN 2 0 XLH (SUTURE) IMPLANT
SUT SILK 0 FSL (SUTURE) IMPLANT
SUT VIC AB 0 CT1 18XCR BRD8 (SUTURE) ×3 IMPLANT
SUT VIC AB 0 CT1 27 (SUTURE) ×3
SUT VIC AB 0 CT1 27XBRD ANBCTR (SUTURE) IMPLANT
SUT VIC AB 0 CT1 27XCR 8 STRN (SUTURE) ×1 IMPLANT
SUT VIC AB 0 CT1 8-18 (SUTURE) ×9
SUT VIC AB 2-0 CT1 (SUTURE) ×3 IMPLANT
SUT VIC AB 3-0 SH 27 (SUTURE)
SUT VIC AB 3-0 SH 27X BRD (SUTURE) IMPLANT
SUT VICRYL 0 TIES 12 18 (SUTURE) ×3 IMPLANT
SYR 50ML LL SCALE MARK (SYRINGE) IMPLANT
SYR CONTROL 10ML LL (SYRINGE) ×2 IMPLANT
SYR TB 1ML 25GX5/8 (SYRINGE) IMPLANT
TOWEL OR 17X24 6PK STRL BLUE (TOWEL DISPOSABLE) ×6 IMPLANT
TRAY FOLEY CATH SILVER 14FR (SET/KITS/TRAYS/PACK) ×3 IMPLANT
WATER STERILE IRR 1000ML POUR (IV SOLUTION) ×1 IMPLANT

## 2014-12-03 NOTE — Addendum Note (Signed)
Addendum  created 12/03/14 1338 by Lauretta Grill, MD   Modules edited: Orders, PRL Based Order Sets

## 2014-12-03 NOTE — H&P (Signed)
History and Physical Interval Note:   12/03/2014   8:35 AM   Julie [name]  has presented today for surgery, with the diagnosis of Irregular Periods, Dysmenorrhea, Polycystic Ovarian Mass  The various methods of treatment have been discussed with the patient and family. After consideration of risks, benefits and other options for treatment, the patient has consented to  Procedure(s): TOTAL VAGINAL HYSTERECTOMY Bilateral Salpingectomy with the possibility of ABDOMINAL HYSTERECTOMY with Bilateral Salpingectomy as a surgical intervention .  I have reviewed the patients' chart and labs.  Questions were answered to the patient's satisfaction.     Eldred Manges  MD

## 2014-12-03 NOTE — Transfer of Care (Signed)
Immediate Anesthesia Transfer of Care Note  Patient: Julie [name]  Procedure(s) Performed: Procedure(s): TOTAL VAGINAL HYSTERECTOMY Bilateral Salpingectomy. aspiration of right ovarian cyst (Bilateral)  Patient Location: PACU  Anesthesia Type:General  Level of Consciousness: awake, alert , oriented and patient cooperative  Airway & Oxygen Therapy: Patient Spontanous Breathing and Patient connected to nasal cannula oxygen  Post-op Assessment: Report given to RN and Post -op Vital signs reviewed and stable  Post vital signs: Reviewed and stable  Last Vitals:  Filed Vitals:   12/03/14 0758  BP: 160/97  Pulse: 73  Temp: 36.9 C  Resp: 18    Complications: No apparent anesthesia complications

## 2014-12-03 NOTE — Anesthesia Preprocedure Evaluation (Addendum)
Anesthesia Evaluation  Patient identified by MRN, date of birth, ID band Patient awake    Reviewed: Allergy & Precautions, NPO status , Patient's Chart, lab work & pertinent test results  Airway Mallampati: III  TM Distance: >3 FB Neck ROM: Full    Dental  (+) Teeth Intact   Pulmonary    breath sounds clear to auscultation       Cardiovascular negative cardio ROS   Rhythm:Regular Rate:Normal     Neuro/Psych  Headaches, negative psych ROS   GI/Hepatic negative GI ROS, Neg liver ROS,   Endo/Other  negative endocrine ROS  Renal/GU negative Renal ROS  negative genitourinary   Musculoskeletal negative musculoskeletal ROS (+)   Abdominal   Peds negative pediatric ROS (+)  Hematology   Anesthesia Other Findings   Reproductive/Obstetrics negative OB ROS                            Lab Results  Component Value Date   WBC 5.8 11/23/2014   HGB 11.7* 11/23/2014   HCT 35.3* 11/23/2014   MCV 94.1 11/23/2014   PLT 275 11/23/2014   No results found for: CREATININE, BUN, NA, K, CL, CO2;  No results found for: INR, PROTIME   Anesthesia Physical Anesthesia Plan  ASA: II  Anesthesia Plan: General   Post-op Pain Management:    Induction: Intravenous  Airway Management Planned: Oral ETT  Additional Equipment:   Intra-op Plan:   Post-operative Plan: Extubation in OR  Informed Consent: I have reviewed the patients History and Physical, chart, labs and discussed the procedure including the risks, benefits and alternatives for the proposed anesthesia with the patient or authorized representative who has indicated his/her understanding and acceptance.   Dental advisory given  Plan Discussed with: CRNA  Anesthesia Plan Comments:         Anesthesia Quick Evaluation

## 2014-12-03 NOTE — Op Note (Signed)
OPERATIVE REPORT   INDICATIONS:abnormal uterine bleeding, fibroids and abnormal pap smear  PRE-OP DIAGNOSIS:Irregular Periods, Dysmenorrhea, Right ovarian cyst  POST-OP DIAGNOSIS;Irregular Periods, Dysmenorrhea, Right ovarian cyst  PROCEDURE:Procedure(s) (LRB): TOTAL VAGINAL HYSTERECTOMY Bilateral Salpingectomy. aspiration of right ovarian cyst    SURGEON:Rylea Selway P  ASSIST: Earnstine Regal certified physician assistant  SPECIMENS: Uterus and cervix, bilateral fallopian tubes with a stitch in the left fallopian tube  DISPOSITION OF SPECIMEN:Delivered to Histology  EBL:200cc  FINDINGS: The uterus was upper limits of normal size. The left ovary was within normal limits as was the tube. The right ovary was enlarged to approximately 5 cm with a 3 cm cyst consistent with a corpus luteum and productive of clear fluid. The Right fallopian tube was within normal limits.    COMPLICATIONS: None   PATIENT TO:  PACU - hemodynamically stable.  PROCEDURE DETAILS: The patient was taken to the operating room after appropriate identification and placed on the operating table.  After the attainment of adequate general anesthesia the patient was placed in the lithotomy position. The perineum and vagina were prepped with multiple layers of Betadine. A Foley catheter was inserted into the bladder and connected to straight drainage.the abdomen was prepped with ChloraPrep and allowed to dry. The abdomen and perineum was draped as a sterile field. A weighted speculum was placed in the posterior vagina and Lahey tenaculae were placed on the anterior and posterior surfaces of the cervix. The cervicovaginal mucosa was injected with a dilute solution of Pitressin. The cervix was circumscribed. The anterior vaginal mucosa wasbluntly dissected off the anterior cervix and the anterior peritoneum entered. The bladder blade was placed and the bladder elevated. The posterior peritoneum was entered sharply and tagged.  Uterosacral ligaments on the right and left side were clamped cut and suture ligated, and the sutures held. The paracervical tissues were then clamped cut and suture ligated. The uterine arteries were clamped cut and suture ligated. The parametral tissues were clamped, cut, and suture ligated.The uterus was inverted and the upper pedicles clamped cut tied with free tie and suture-ligated. The uterus and cervix were removed from the operative field. A large right ovarian cyst was then drained of clear fluid and noted to be a corpus luteum cyst. The right fallopian tube was grasped with a Babcock clamp and the mesial salpinx clamped with the Heaney clamp. The tube was excised in the base tied with a suture of 0 Vicryl. A similar procedure was carried out on the left with the left fallopian tube in that tube was marked with a suture. Hemostasis was noted to be adequate.  The McCall culdoplasty sutures were then placed incorporating the uterosacral ligaments on either side, and the intervening peritoneum.  These were held. The vaginal angles were created with the sutures from the  uterosacral ligaments that had been held and placed through the anterior and posterior surfaces of the vagina then tied down. The remainder of the vaginal cuff was closed with figure-of-eight sutures of.  All sutures used were 0 Vicryl.  The McCall culdoplasty sutures were then tied down. A 2 inch plain vaginal packing moistened with Estrace cream was placed in the vagina. The patient was awakened from general anesthesia and taken to the recovery room in satisfactory condition having tolerated the procedure well the sponge and instrument counts correct.  Eldred Manges, MD 11:56 AM

## 2014-12-03 NOTE — Anesthesia Procedure Notes (Signed)
Procedure Name: Intubation Date/Time: 12/03/2014 9:15 AM Performed by: Raenette Rover Pre-anesthesia Checklist: Patient identified, Patient being monitored, Emergency Drugs available and Suction available Patient Re-evaluated:Patient Re-evaluated prior to inductionOxygen Delivery Method: Circle system utilized Preoxygenation: Pre-oxygenation with 100% oxygen Intubation Type: IV induction Ventilation: Mask ventilation without difficulty Laryngoscope Size: Mac and 3 Grade View: Grade I Tube type: Oral Tube size: 7.0 mm Number of attempts: 1 Airway Equipment and Method: Patient positioned with wedge pillow and Stylet Placement Confirmation: ETT inserted through vocal cords under direct vision,  breath sounds checked- equal and bilateral,  positive ETCO2 and CO2 detector Secured at: 21 cm Tube secured with: Tape Dental Injury: Teeth and Oropharynx as per pre-operative assessment

## 2014-12-03 NOTE — Anesthesia Postprocedure Evaluation (Signed)
  Anesthesia Post-op Note  Patient: Julie [name]  Procedure(s) Performed: Procedure(s): TOTAL VAGINAL HYSTERECTOMY Bilateral Salpingectomy. aspiration of right ovarian cyst (Bilateral)  Patient Location: PACU  Anesthesia Type:General  Level of Consciousness: awake and alert   Airway and Oxygen Therapy: Patient Spontanous Breathing  Post-op Pain: mild  Post-op Assessment: Post-op Vital signs reviewed and Patient's Cardiovascular Status Stable              Post-op Vital Signs: Reviewed and stable  Last Vitals:  Filed Vitals:   12/03/14 1300  BP: 157/94  Pulse: 99  Temp:   Resp: 16    Complications: No apparent anesthesia complications

## 2014-12-03 NOTE — Progress Notes (Signed)
Day of Surgery Procedure(s) (LRB): TOTAL VAGINAL HYSTERECTOMY Bilateral Salpingectomy. aspiration of right ovarian cyst (Bilateral)  Subjective: Patient reports no nausea, vomiting.  Incisional pain and tolerating PO.    Objective: I have reviewed patient's vital signs, intake and output and medications.  General: alert and cooperative Resp: clear to auscultation bilaterally Cardio: regular rate and rhythm, S1, S2 normal, no murmur, click, rub or gallop GI: soft, non-tender; bowel sounds normal; no masses,  no organomegaly Extremities: extremities normal, atraumatic, no cyanosis or edema Vaginal Bleeding: none  Assessment: s/p Procedure(s): TOTAL VAGINAL HYSTERECTOMY Bilateral Salpingectomy. aspiration of right ovarian cyst (Bilateral): stable  Plan: Advance diet Encourage ambulation Advance to PO medication Discharge home 12/04/2014     Chalet Kerwin P 12/03/2014, 6:51 PM

## 2014-12-04 ENCOUNTER — Encounter (HOSPITAL_COMMUNITY): Payer: Self-pay | Admitting: Obstetrics and Gynecology

## 2014-12-04 DIAGNOSIS — N939 Abnormal uterine and vaginal bleeding, unspecified: Secondary | ICD-10-CM | POA: Diagnosis not present

## 2014-12-04 DIAGNOSIS — D500 Iron deficiency anemia secondary to blood loss (chronic): Secondary | ICD-10-CM | POA: Diagnosis present

## 2014-12-04 LAB — CBC
HCT: 29.5 % — ABNORMAL LOW (ref 36.0–46.0)
HEMOGLOBIN: 10 g/dL — AB (ref 12.0–15.0)
MCH: 31.3 pg (ref 26.0–34.0)
MCHC: 33.9 g/dL (ref 30.0–36.0)
MCV: 92.5 fL (ref 78.0–100.0)
Platelets: 219 10*3/uL (ref 150–400)
RBC: 3.19 MIL/uL — ABNORMAL LOW (ref 3.87–5.11)
RDW: 14.5 % (ref 11.5–15.5)
WBC: 10.3 10*3/uL (ref 4.0–10.5)

## 2014-12-04 MED ORDER — ONDANSETRON HCL 4 MG PO TABS: 4.0000 mg | ORAL_TABLET | Freq: Three times a day (TID) | ORAL | Status: DC | PRN

## 2014-12-04 MED ORDER — IBUPROFEN 600 MG PO TABS: ORAL_TABLET | ORAL | Status: DC

## 2014-12-04 MED ORDER — OXYCODONE-ACETAMINOPHEN 5-325 MG PO TABS: 1.0000 | ORAL_TABLET | ORAL | Status: DC | PRN

## 2014-12-04 MED ORDER — DOCUSATE SODIUM 50 MG PO CAPS: ORAL_CAPSULE | ORAL | Status: DC

## 2014-12-04 NOTE — Anesthesia Postprocedure Evaluation (Signed)
  Anesthesia Post-op Note  Patient: Julie [name]  Procedure(s) Performed: Procedure(s): TOTAL VAGINAL HYSTERECTOMY Bilateral Salpingectomy. aspiration of right ovarian cyst (Bilateral)  Patient Location: [facility]  Anesthesia Type:General  Level of Consciousness: awake, alert  and oriented  Airway and Oxygen Therapy: Patient Spontanous Breathing  Post-op Pain: none  Post-op Assessment: Post-op Vital signs reviewed, Patient's Cardiovascular Status Stable, PATIENT'S CARDIOVASCULAR STATUS UNSTABLE, Patent Airway, No signs of Nausea or vomiting, Adequate PO intake, Pain level controlled, No headache, No backache and Patient able to bend at knees              Post-op Vital Signs: Reviewed and stable  Last Vitals:  Filed Vitals:   12/04/14 0641  BP: 136/70  Pulse: 77  Temp: 37.2 C  Resp: 18    Complications: No apparent anesthesia complications

## 2014-12-04 NOTE — Discharge Summary (Signed)
Physician Discharge Summary  Patient ID: Julie [name] MRN: [id number] DOB/AGE: 38-Sep-1977 38 y.o.  Admit date: 12/03/2014 Discharge date: 12/04/2014   Discharge Diagnoses: Menorrhagia and Uterine Fibroid Principal Problem:   Menorrhagia Active Problems:   Fibroids, submucosal   IUD (intrauterine device) in place   Irregular bleeding   Operation: Total Vaginal Hysterectomy with Bilateral Salpingectomy and Aspiration of Right Ovarian Cyst   Discharged Condition: Good  Hospital Course: On the date of admission,  the patient underwent the aforementioned procedures and tolerated them well.   Post operative course was unremarkable with the patient resuming bowel and bladder function by post operative day #1 and was therefore deemed ready for discharge home.  Discharge hemoglobin/hematocrit was  10.0/29.5 (pre-op 11.7/35.3).   Disposition: 01-Home or Self Care  Discharge Medications:    Medication List    STOP taking these medications        BC HEADACHE PO      TAKE these medications        BIOTIN MAXIMUM STRENGTH 10 MG Tabs  Generic drug:  Biotin  Take 1 tablet by mouth daily.     ibuprofen 600 MG tablet  Commonly known as:  ADVIL,MOTRIN  1  po   pc every 6 hours for 5 days then as needed for pain     levonorgestrel 20 MCG/24HR IUD  Commonly known as:  MIRENA  1 each by Intrauterine route once.     ondansetron 4 MG tablet  Commonly known as:  ZOFRAN  Take 1 tablet (4 mg total) by mouth every 8 (eight) hours as needed for nausea or vomiting.     oxyCODONE-acetaminophen 5-325 MG tablet  Commonly known as:  PERCOCET/ROXICET  Take 1-2 tablets by mouth every 4 (four) hours as needed for severe pain (moderate to severe pain (when tolerating fluids)).         Follow-up: Dr. Lorriane Shire P. Haygood on January 07, 2015 at 10:15 a.m.   SignedEarnstine Regal, PA-C 12/04/2014, 7:58 AM

## 2014-12-04 NOTE — Progress Notes (Signed)
Pt verbalizes understanding of d/c instructions, medications, follow up appts, when to seek medical attn, and belongings policy. IV was d/c by NT. Pt has no questions at this time. Pt ambulated to the main entrance accompanied by NT. Pts family is with her and will be driving her home. Julie [name]

## 2014-12-04 NOTE — Progress Notes (Signed)
Julie [name] is a70 y.o.  [id number]  Post Op Date # 1:  TVH/BS  Subjective: Patient is Doing well postoperatively. Patient has Pain is controlled with current analgesics. Medications being used: prescription NSAID's including Ketorolac and narcotic analgesics including Percocet. Ambulating without difficulty, tolerating p.o. (eating a regular breakfast now) but hasn't voided since Foley was removed (approximately an hour ago).   Objective: Vital signs in last 24 hours: Temp:  [98.4 F (36.9 C)-99.5 F (37.5 C)] 98.9 F (37.2 C) (11/18 0641) Pulse Rate:  [73-99] 77 (11/18 0641) Resp:  [11-19] 18 (11/18 0641) BP: (127-188)/(61-106) 136/70 mmHg (11/18 0641) SpO2:  [97 %-100 %] 100 % (11/18 0641) Weight:  [188 lb (85.276 kg)] 188 lb (85.276 kg) (11/17 1500)  Intake/Output from previous day: 11/17 0701 - 11/18 0700 In: 3937 [I.V.:3937] Out: 3725 [Urine:3525] Intake/Output this shift:    Recent Labs Lab 12/04/14 0605  WBC 10.3  HGB 10.0*  HCT 29.5*  PLT 219    No results for input(s): NA, K, CL, CO2, BUN, CREATININE, CALCIUM, PROT, BILITOT, ALKPHOS, ALT, AST, GLUCOSE in the last 168 hours.  Invalid input(s): LABALBU  EXAM: General: alert, cooperative and no distress Resp: clear to auscultation bilaterally Cardio: regular rate and rhythm, S1, S2 normal, no murmur, click, rub or gallop GI: Bowel sounds present, soft Extremities: Negative Homan's and no calf tenderness.   Assessment: s/p Procedure(s): TOTAL VAGINAL HYSTERECTOMY Bilateral Salpingectomy. aspiration of right ovarian cyst: stable, progressing well and anemia  Plan: Awaiting patient to void  Routine care with plans for discharge home later today     [place], PA-C 12/04/2014 7:47 AM

## 2014-12-04 NOTE — Discharge Instructions (Signed)
Call [facility] @ [phone] if:  You have a temperature greater than or equal to 100.4 degrees Farenheit orally You have pain that is not made better by the pain medication given and taken as directed You have excessive bleeding or problems urinating  Take Colace (Docusate Sodium/Stool Softener) 100 mg 2-3 times daily while taking narcotic pain medicine to avoid constipation or until bowel movements are regular. Take Ibuprofen 600 mg with food every 6 hours for 5 days then as needed for pain  You may drive after 2 weeks You may walk up steps  You may shower  You may resume a regular diet    Do not lift over 15 pounds for 6 weeks Avoid anything in vagina for 6 weeks (or until after your post-operative visit)  Keep follow up with Dr. Leo Grosser on January 07, 2015 at 10:15 a.m.

## 2014-12-04 NOTE — Addendum Note (Signed)
Addendum  created 12/04/14 0751 by Riki Sheer, CRNA   Modules edited: Notes Section   Notes Section:  File: [id number]

## 2015-01-22 DIAGNOSIS — B373 Candidiasis of vulva and vagina: Secondary | ICD-10-CM | POA: Insufficient documentation

## 2015-01-22 DIAGNOSIS — B3731 Acute candidiasis of vulva and vagina: Secondary | ICD-10-CM | POA: Insufficient documentation

## 2015-09-14 DIAGNOSIS — R7303 Prediabetes: Secondary | ICD-10-CM | POA: Insufficient documentation

## 2016-02-25 ENCOUNTER — Emergency Department (HOSPITAL_COMMUNITY)
Admission: EM | Admit: 2016-02-25 | Discharge: 2016-02-26 | Disposition: A | Discharge status: Home / Self Care | Payer: BC Managed Care – PPO | Attending: Emergency Medicine | Admitting: Emergency Medicine

## 2016-02-25 ENCOUNTER — Emergency Department (HOSPITAL_COMMUNITY): Payer: BC Managed Care – PPO

## 2016-02-25 ENCOUNTER — Encounter (HOSPITAL_COMMUNITY): Payer: Self-pay | Admitting: *Deleted

## 2016-02-25 DIAGNOSIS — Z79899 Other long term (current) drug therapy: Secondary | ICD-10-CM | POA: Diagnosis not present

## 2016-02-25 DIAGNOSIS — R079 Chest pain, unspecified: Secondary | ICD-10-CM

## 2016-02-25 DIAGNOSIS — R0789 Other chest pain: Secondary | ICD-10-CM | POA: Diagnosis present

## 2016-02-25 LAB — CBC
HEMATOCRIT: 34.7 % — AB (ref 36.0–46.0)
HEMOGLOBIN: 12.2 g/dL (ref 12.0–15.0)
MCH: 32.6 pg (ref 26.0–34.0)
MCHC: 35.2 g/dL (ref 30.0–36.0)
MCV: 92.8 fL (ref 78.0–100.0)
Platelets: 257 10*3/uL (ref 150–400)
RBC: 3.74 MIL/uL — AB (ref 3.87–5.11)
RDW: 13 % (ref 11.5–15.5)
WBC: 6.2 10*3/uL (ref 4.0–10.5)

## 2016-02-25 LAB — BASIC METABOLIC PANEL
ANION GAP: 14 (ref 5–15)
BUN: 17 mg/dL (ref 6–20)
CO2: 26 mmol/L (ref 22–32)
Calcium: 9.8 mg/dL (ref 8.9–10.3)
Chloride: 97 mmol/L — ABNORMAL LOW (ref 101–111)
Creatinine, Ser: 1.21 mg/dL — ABNORMAL HIGH (ref 0.44–1.00)
GFR calc non Af Amer: 56 mL/min — ABNORMAL LOW (ref >60)
GLUCOSE: 143 mg/dL — AB (ref 65–99)
POTASSIUM: 4 mmol/L (ref 3.5–5.1)
Sodium: 137 mmol/L (ref 135–145)

## 2016-02-25 LAB — TROPONIN I: Troponin I: <0.03 ng/mL (ref <0.03)

## 2016-02-25 MED ORDER — OMEPRAZOLE 20 MG PO CPDR: 20.0000 mg | DELAYED_RELEASE_CAPSULE | Freq: Every day | ORAL | 0 refills | Status: DC

## 2016-02-25 MED ORDER — SUCRALFATE 1 GM/10ML PO SUSP: 1.0000 g | Freq: Three times a day (TID) | ORAL | 0 refills | Status: DC

## 2016-02-25 NOTE — ED Triage Notes (Signed)
The pt is c/o mid-chest pain or epigastric region for 2-3 hours.  She also has nausea and dizziness  lmplmp partial hjyst

## 2016-02-25 NOTE — ED Provider Notes (Signed)
Emergency Department Provider Note   I have reviewed the triage vital signs and the nursing notes.   HISTORY  Chief Complaint Chest Pain   HPI Julie [name] is a 41 y.o. female with PMH of HTN presents to the ED for evaluation of chest pressure and epigastric pain for the last week. She describes central chest pressure that does not radiate. Feels like a "heaviness" with occasional sharp pain. No exacerbating or alleviating factors. No fever, chills, or productive cough. Intermittent symptoms this week with no modifying factors. Chest pressure has been constant today for the last 6-8 hours which prompted ED presentation.   Past Medical History:  Diagnosis Date  . Alopecia    h/o  . Anemia during pregnancy    history only  . Chlamydia   . Fibroadenoma of breast   . Heart murmur    as child - resolved as adult - no problem  . Hirsutism   . Menorrhagia   . Migraine   . SVD (spontaneous vaginal delivery)    x 3  . Trichomonas   . 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 Yeast infection     Patient Active Problem List   Diagnosis Date Noted  . Anemia due to chronic blood loss 12/04/2014    Past Surgical History:  Procedure Laterality Date  . CESAREAN SECTION    . DILATION AND CURETTAGE OF UTERUS    . DILITATION & CURRETTAGE/HYSTROSCOPY WITH VERSAPOINT RESECTION N/A 12/20/2012   Procedure: DILATATION & CURETTAGE/HYSTEROSCOPY WITH VERSAPOINT RESECTION;  Surgeon: Eldred Manges, MD;  Location: [facility];  Service: Gynecology;  Laterality: N/A;  . HYSTEROSCOPY    . TUMOR REMOVAL     breast - right  . VAGINAL HYSTERECTOMY Bilateral 12/03/2014   Procedure: TOTAL VAGINAL HYSTERECTOMY Bilateral Salpingectomy. aspiration of right ovarian cyst;  Surgeon: Eldred Manges, MD;  Location: [facility];  Service: Gynecology;  Laterality: Bilateral;  . VULVA /PERINEUM BIOPSY Left 12/20/2012   Procedure: VULVAR BIOPSY;  Surgeon: Eldred Manges, MD;  Location: [facility];  Service:  Gynecology;  Laterality: Left;  . WISDOM TOOTH EXTRACTION      Current Outpatient Rx  . Order #: [id number] Class: Historical Med  . Order #: [id number] Class: Normal  . Order #: [id number] Class: Print  . Order #: [id number] Class: Print  . Order #: [id number] Class: Print  . Order #: [id number] Class: Print  . Order #: [id number] Class: Print    Allergies Patient has no known allergies.  Family History  Problem Relation Age of Onset  . Hypertension Maternal Grandmother   . Diabetes Other   . Stroke Other   . Cancer Other   . Hypertension Maternal Aunt     Social History Social History  Substance Use Topics  . Smoking status: Never Smoker  . Smokeless tobacco: Never Used  . Alcohol use No    Review of Systems  Constitutional: No fever/chills Eyes: No visual changes. ENT: No sore throat. Cardiovascular: Positive chest pain. Respiratory: Denies shortness of breath. Gastrointestinal: No abdominal pain.  No nausea, no vomiting.  No diarrhea.  No constipation. Genitourinary: Negative for dysuria. Musculoskeletal: Negative for back pain. Skin: Negative for rash. Neurological: Negative for headaches, focal weakness or numbness.  10-point ROS otherwise negative.  ____________________________________________   PHYSICAL EXAM:  VITAL SIGNS: ED Triage Vitals  Enc Vitals Group     BP 02/25/16 2138 145/86     Pulse Rate 02/25/16 2138 73     Resp 02/25/16 2138 18  Temp 02/25/16 2138 98.1 F (36.7 C)     SpO2 02/25/16 2138 99 %     Weight 02/25/16 2140 180 lb (81.6 kg)     Height 02/25/16 2140 5\' 4"  (1.626 m)     Pain Score 02/25/16 2141 6   Constitutional: Alert and oriented. Well appearing and in no acute distress. Eyes: Conjunctivae are normal.  Head: Atraumatic. Nose: No congestion/rhinnorhea. Mouth/Throat: Mucous membranes are moist Neck: No stridor.   Cardiovascular: Normal rate, regular rhythm. Good peripheral circulation. Grossly normal heart sounds.     Respiratory: Normal respiratory effort.  No retractions. Lungs CTAB. Gastrointestinal: Soft and nontender. No distention.  Musculoskeletal: No lower extremity tenderness nor edema. No gross deformities of extremities. Neurologic:  Normal speech and language. No gross focal neurologic deficits are appreciated.  Skin:  Skin is warm, dry and intact. No rash noted. Psychiatric: Mood and affect are normal. Speech and behavior are normal.  ____________________________________________   LABS (all labs ordered are listed, but only abnormal results are displayed)  Labs Reviewed  BASIC METABOLIC PANEL - Abnormal; Notable for the following:       Result Value   Chloride 97 (*)    Glucose, Bld 143 (*)    Creatinine, Ser 1.21 (*)    GFR calc non Af Amer 56 (*)    All other components within normal limits  CBC - Abnormal; Notable for the following:    RBC 3.74 (*)    HCT 34.7 (*)    All other components within normal limits  TROPONIN I   ____________________________________________  EKG   EKG Interpretation  Date/Time:  Friday February 25 2016 21:38:20 EST Ventricular Rate:  73 PR Interval:  168 QRS Duration: 82 QT Interval:  394 QTC Calculation: 434 R Axis:   64 Text Interpretation:  Normal sinus rhythm Normal ECG No STEMI.  Confirmed by Kristyna Bradstreet MD, Marshon Bangs [phone]) on 02/26/2016 12:04:00 AM      ____________________________________________  RADIOLOGY  Dg Chest 2 View  Result Date: 02/25/2016 CLINICAL DATA:  Chest pain EXAM: CHEST  2 VIEW COMPARISON:  Chest radiograph 05/05/2013 FINDINGS: The lungs are well inflated. Cardiomediastinal contours are normal. No pneumothorax or pleural effusion. No focal airspace consolidation or pulmonary edema. IMPRESSION: Clear lungs. Electronically Signed   By: Ulyses Jarred M.D.   On: 02/25/2016 22:07    ____________________________________________   PROCEDURES  Procedure(s) performed:    Procedures  None ____________________________________________   INITIAL IMPRESSION / ASSESSMENT AND PLAN / ED COURSE  Pertinent labs & imaging results that were available during my care of the patient were reviewed by me and considered in my medical decision making (see chart for details).  Patient presents to the ED with intermittent CP for the last week with constant pain for the last 6-8 hours. No active CP during my exam. No modifying factors. Low risk for DVT/PE by [place] and PERC negative. Normal EKG. Heart score 2 (risk factors) with negative troponin. Normal CXR. Patient has plan to follow with PCP on Monday and will discuss stress testing. Provided Carafate and Omeprazole to start prior to that appointment.   At this time, I do not feel there is any life-threatening condition present. I have reviewed and discussed all results (EKG, imaging, lab, urine as appropriate), exam findings with patient. I have reviewed nursing notes and appropriate previous records.  I feel the patient is safe to be discharged home without further emergent workup. Discussed usual and customary return precautions. Patient and family (if present)  verbalize understanding and are comfortable with this plan.  Patient will follow-up with their primary care provider. If they do not have a primary care provider, information for follow-up has been provided to them. All questions have been answered.  ____________________________________________  FINAL CLINICAL IMPRESSION(S) / ED DIAGNOSES  Final diagnoses:  Nonspecific chest pain     MEDICATIONS GIVEN DURING THIS VISIT:  None  NEW OUTPATIENT MEDICATIONS STARTED DURING THIS VISIT:  Discharge Medication List as of 02/25/2016 11:58 PM    START taking these medications   Details  omeprazole (PRILOSEC) 20 MG capsule Take 1 capsule (20 mg total) by mouth daily., Starting Fri 02/25/2016, Print    sucralfate (CARAFATE) 1 GM/10ML suspension Take 10 mLs (1 g total)  by mouth 4 (four) times daily -  with meals and at bedtime., Starting Fri 02/25/2016, Print          Note:  This document was prepared using Dragon voice recognition software and may include unintentional dictation errors.  Nanda Quinton, MD Emergency Medicine   Margette Fast, MD 02/26/16 1302

## 2016-02-25 NOTE — Discharge Instructions (Signed)

## 2016-04-26 ENCOUNTER — Encounter: Payer: BC Managed Care – PPO | Attending: Family Medicine | Admitting: Registered"

## 2016-04-26 DIAGNOSIS — E119 Type 2 diabetes mellitus without complications: Secondary | ICD-10-CM

## 2016-04-26 DIAGNOSIS — R739 Hyperglycemia, unspecified: Secondary | ICD-10-CM | POA: Diagnosis not present

## 2016-04-26 DIAGNOSIS — Z713 Dietary counseling and surveillance: Secondary | ICD-10-CM | POA: Insufficient documentation

## 2016-04-26 NOTE — Progress Notes (Signed)
Diabetes Self-Management Education  Visit Type: First/Initial  Appt. Start Time: 1540 Appt. End Time: 1700  04/27/2016  [name], identified by name and date of birth, is a 41 y.o. female with a diagnosis of Diabetes: Type 2.   ASSESSMENT Pt was referred by both her PCP and gynecologist. Pt states she has changed her diet and started exercising 2x/week after T2DM diagnosis and her average fasting BG values have come down in the last 2 weeks.   Pt states she is taking several supplements for fatigue which she researched on her own and include: B12, Biotin, Turmeric, and Vit D. Pt states when she takes these consistently she has more energy. Pt states she also takes a cinnamon supplement because she heard it is good for diabetes.  After Pt visit RD reviewed referral notes more thoroughly and found a problem list from her gynocologist Julie Flack, MD, which included hirsutism and alopecia. Also found in history AUB, Dysmenorrhea and history of anemia. At follow-up visit RD may want to review symptoms with Pt and check if she has had prior PCOS assessment.    Height 5\' 4"  (1.626 m), weight 181 lb 3.2 oz (82.2 kg), last menstrual period 11/10/2014. Body mass index is 31.1 kg/m.      Diabetes Self-Management Education - 04/26/16 1549      Visit Information   Visit Type First/Initial     Initial Visit   Diabetes Type Type 2   Are you currently following a meal plan? No   Are you taking your medications as prescribed? Not on Medications   Date Diagnosed 02/2016     Health Coping   How would you rate your overall health? Fair     Psychosocial Assessment   Patient Belief/Attitude about Diabetes Afraid   How often do you need to have someone help you when you read instructions, pamphlets, or other written materials from your doctor or pharmacy? 1 - Never   What is the last grade level you completed in school? graduate     Complications   Last HgB A1C per patient/outside  source 7.3 %  02/2016   How often do you check your blood sugar? 1-2 times/day  fasting am & after work ~3 hrs postprandial   Fasting Blood glucose range (mg/dL) 130-179  136-157   Postprandial Blood glucose range (mg/dL) 70-129  107-110 3 hrs after lunch   Number of hypoglycemic episodes per month 0   Number of hyperglycemic episodes per week 0   Have you had a dilated eye exam in the past 12 months? Yes   Have you had a dental exam in the past 12 months? No   Are you checking your feet? No     Dietary Intake   Breakfast none OR fruit   Snack (morning) none   Lunch salad, grilled chicken OR salmon OR iron hand cafe quinoa, veggie salad, bean OR can of soup   Snack (afternoon) veggie chips OR fruit OR popcorn   Dinner salad OR soup   Snack (evening) none OR veggie, fruit   Beverage(s) water     Exercise   Exercise Type Moderate (swimming / aerobic walking)   How many days per week to you exercise? 2   How many minutes per day do you exercise? 30   Total minutes per week of exercise 60     Patient Education   Previous Diabetes Education No   Disease state  Definition of diabetes, type 1 and 2, and  the diagnosis of diabetes   Nutrition management  Role of diet in the treatment of diabetes and the relationship between the three main macronutrients and blood glucose level;Food label reading, portion sizes and measuring food.;Carbohydrate counting   Physical activity and exercise  Role of exercise on diabetes management, blood pressure control and cardiac health.   Monitoring Identified appropriate SMBG and/or A1C goals.;Yearly dilated eye exam   Acute complications Taught treatment of hypoglycemia - the 15 rule.   Chronic complications Relationship between chronic complications and blood glucose control;Lipid levels, blood glucose control and heart disease   Psychosocial adjustment Role of stress on diabetes     Individualized Goals (developed by patient)   Nutrition General  guidelines for healthy choices and portions discussed   Physical Activity Exercise 3-5 times per week   Monitoring  test my blood glucose as discussed     Outcomes   Expected Outcomes Demonstrated interest in learning. Expect positive outcomes   Future DMSE 4-6 wks   Program Status Completed      Individualized Plan for Diabetes Self-Management Training:   Learning Objective:  Patient will have a greater understanding of diabetes self-management. Patient education plan is to attend individual and/or group sessions per assessed needs and concerns.  Patient Instructions  Plan:  Aim for 2-3 Carb Choices per meal (30-45 grams) +/- 1 either way  Aim for 0-1 Carbs per snack if hungry  Include protein in moderation with your meals and snacks Consider reading food labels for Total Carbohydrate and sodium of foods Consider increasing your activity level by 1 day (for total of 3 days) for 30 minutes tolerated Continue checking BG at alternate times per day as directed by MD  Consider having nuts daily [email or web address]   Expected Outcomes:  Demonstrated interest in learning. Expect positive outcomes  Education material provided: Living Well with Diabetes, Food label handouts, A1C conversion sheet, My Plate, Snack sheet and Carbohydrate counting sheet  If problems or questions, patient to contact team via:  Phone and Email  Future DSME appointment: 4-6 wks

## 2016-04-26 NOTE — Patient Instructions (Signed)
Plan:  Aim for 2-3 Carb Choices per meal (30-45 grams) +/- 1 either way  Aim for 0-1 Carbs per snack if hungry  Include protein in moderation with your meals and snacks Consider reading food labels for Total Carbohydrate and sodium of foods Consider increasing your activity level by 1 day (for total of 3 days) for 30 minutes tolerated Continue checking BG at alternate times per day as directed by MD  Consider having nuts daily [email or web address]

## 2016-04-27 ENCOUNTER — Encounter: Payer: Self-pay | Admitting: Registered"

## 2016-06-09 ENCOUNTER — Other Ambulatory Visit: Payer: Self-pay | Admitting: Neurosurgery

## 2016-06-09 DIAGNOSIS — M5412 Radiculopathy, cervical region: Secondary | ICD-10-CM

## 2016-06-13 ENCOUNTER — Ambulatory Visit: Payer: BC Managed Care – PPO | Attending: Neurosurgery | Admitting: Physical Therapy

## 2016-06-13 ENCOUNTER — Encounter: Payer: Self-pay | Admitting: Physical Therapy

## 2016-06-13 DIAGNOSIS — M542 Cervicalgia: Secondary | ICD-10-CM | POA: Diagnosis present

## 2016-06-13 DIAGNOSIS — M6281 Muscle weakness (generalized): Secondary | ICD-10-CM

## 2016-06-13 DIAGNOSIS — M25512 Pain in left shoulder: Secondary | ICD-10-CM | POA: Diagnosis present

## 2016-06-13 NOTE — Patient Instructions (Signed)
Posture - Standing   Good posture is important. Avoid slouching and forward head thrust. Maintain curve in low back and align ears over shoulders, hips over ankles.  Pull your belly button in toward your back bone. Posture Tips DO: - stand tall and erect - keep chin tucked in - keep head and shoulders in alignment - check posture regularly in mirror or large window - pull head back against headrest in car seat;  Change your position often.  Sit with lumbar support. DON'T: - slouch or slump while watching TV or reading - sit, stand or lie in one position  for too long;  Sitting is especially hard on the spine so if you sit at a desk/use the computer, then stand up often! Copyright  VHI. All rights reserved.  Posture - Sitting  Sit upright, head facing forward. Try using a roll to support lower back. Keep shoulders relaxed, and avoid rounded back. Keep hips level with knees. Avoid crossing legs for long periods. Copyright  VHI. All rights reserved.  Chronic neck strain can develop because of poor posture and faulty work habits  Postural strain related to slumped sitting and forward head posture is a leading cause of headaches, neck and upper back pain  General strengthening and flexibility exercises are helpful in the treatment of neck pain.  Most importantly, you should learn to correct the posture that may be contributing to chronic pain.   Change positions frequently  Change your work or home environment to improve posture and mechanics.    

## 2016-06-13 NOTE — Therapy (Signed)
[facility] [address], [place], [place], [address] Phone: [phone]   Fax:  [phone]  Physical Therapy Evaluation  Patient Details  Name: Julie [name] MRN: [id number] Date of Birth: 31-Jan-1975 Referring Provider: Consuella Lose   Encounter Date: 06/13/2016      PT End of Session - 06/13/16 0853    Visit Number 1   Date for PT Re-Evaluation 08/08/16   PT Start Time 0843   PT Stop Time 0920   PT Time Calculation (min) 37 min   Activity Tolerance Patient tolerated treatment well   Behavior During Therapy [facility] for tasks assessed/performed      Past Medical History:  Diagnosis Date  . Alopecia    h/o  . Anemia during pregnancy    history only  . Chlamydia   . Fibroadenoma of breast   . Heart murmur    as child - resolved as adult - no problem  . Hirsutism   . Menorrhagia   . Migraine   . SVD (spontaneous vaginal delivery)    x 3  . Trichomonas   . 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 Yeast infection     Past Surgical History:  Procedure Laterality Date  . CESAREAN SECTION    . DILATION AND CURETTAGE OF UTERUS    . DILITATION & CURRETTAGE/HYSTROSCOPY WITH VERSAPOINT RESECTION N/A 12/20/2012   Procedure: DILATATION & CURETTAGE/HYSTEROSCOPY WITH VERSAPOINT RESECTION;  Surgeon: Eldred Manges, MD;  Location: [facility];  Service: Gynecology;  Laterality: N/A;  . HYSTEROSCOPY    . TUMOR REMOVAL     breast - right  . VAGINAL HYSTERECTOMY Bilateral 12/03/2014   Procedure: TOTAL VAGINAL HYSTERECTOMY Bilateral Salpingectomy. aspiration of right ovarian cyst;  Surgeon: Eldred Manges, MD;  Location: [facility];  Service: Gynecology;  Laterality: Bilateral;  . VULVA /PERINEUM BIOPSY Left 12/20/2012   Procedure: VULVAR BIOPSY;  Surgeon: Eldred Manges, MD;  Location: [facility];  Service: Gynecology;  Laterality: Left;  . WISDOM TOOTH EXTRACTION      There were no vitals filed for this visit.        Subjective Assessment - 06/13/16 0845    Subjective Pain is mostly in my left arm and shoulder.  Just woke up with it on April 28th.  It has been constant.  Yesterday was not as bad as it has been.     Limitations Lifting   Diagnostic tests MRI showed ruptured disc cervical   Patient Stated Goals pain relief and strength   Currently in Pain? Yes   Pain Score 7   usually constant 5-6   Pain Location Neck   Pain Orientation Left   Pain Descriptors / Indicators Sharp   Pain Type Acute pain   Pain Radiating Towards started in shoulder into wrist   Pain Onset More than a month ago   Pain Frequency Constant   Aggravating Factors  sitting and typing   Pain Relieving Factors arm up and external rotation   Effect of Pain on Daily Activities wakes at night a few times, putting weight on the left arm, lifting   Multiple Pain Sites No            [facility] PT Assessment - 06/13/16 0001      Assessment   Medical Diagnosis M54.12 (ICD-10-CM) - Radiculopathy, cervical region   Referring Provider NUNDKUMAR, NEELESH    Hand Dominance Right   Prior Therapy No     Precautions  Precautions None     Restrictions   Weight Bearing Restrictions No     Balance Screen   Has the patient fallen in the past 6 months No     [place] residence   Living Arrangements Children     Prior Function   Level of Independence Independent   Vocation Full time employment   Vocation Requirements sitting and typing     Cognition   Overall Cognitive Status Within Functional Limits for tasks assessed     Observation/Other Assessments   Focus on Therapeutic Outcomes (FOTO)  43% limited     Posture/Postural Control   Posture/Postural Control Postural limitations   Postural Limitations Rounded Shoulders;Forward head     ROM / Strength   AROM / PROM / Strength AROM;Strength     AROM   AROM Assessment Site Cervical   Cervical Flexion 40   Cervical Extension 40    Cervical - Right Side Bend 35   Cervical - Left Side Bend 40  pain   Cervical - Right Rotation 45   Cervical - Left Rotation 45     Strength   Strength Assessment Site Shoulder   Right/Left Shoulder Right;Left   Right Shoulder Flexion 5/5   Right Shoulder Extension 5/5   Right Shoulder ABduction 5/5   Right Shoulder Internal Rotation 5/5   Right Shoulder External Rotation 5/5   Right Shoulder Horizontal ABduction 5/5   Right Shoulder Horizontal ADduction 5/5   Left Shoulder Flexion 4+/5   Left Shoulder Extension 5/5   Left Shoulder ABduction 4/5   Left Shoulder Internal Rotation 5/5   Left Shoulder External Rotation 4+/5   Left Shoulder Horizontal ABduction 4/5   Left Shoulder Horizontal ADduction 4/5     Palpation   Palpation comment suboccipitals, cervical paraspinals, upper trap - tight and tender     Special Tests    Special Tests Cervical   Cervical Tests Spurling's;Dictraction     Spurling's   Findings Negative     Distraction Test   Findngs Positive   Comment decreased arm and shoulder pain when performed supine     Ambulation/Gait   Gait Pattern Within Functional Limits            Objective measurements completed on examination: See above findings.                  PT Education - 06/13/16 0929    Education provided Yes   Education Details posture   Person(s) Educated Patient   Methods Explanation;Handout;Tactile cues;Verbal cues   Comprehension Verbalized understanding          PT Short Term Goals - 06/13/16 1339      PT SHORT TERM GOAL #1   Title pt will be demonstrate improved posture with sitting and standing activities for reduced pain    Time 4   Period Weeks   Status New     PT SHORT TERM GOAL #2   Title pt will have 25% less pain throughout the day   Time 4   Period Weeks   Status New     PT SHORT TERM GOAL #3   Title pt will be independent with initial HEP   Time 4   Period Weeks   Status New            PT Long Term Goals - 06/13/16 1308      PT LONG TERM GOAL #1   Title pt will be  independent with advanced HEP   Time 8   Period Weeks   Status New     PT LONG TERM GOAL #2   Title FOTO < or = to 28% limited   Time 8   Period Weeks   Status New     PT LONG TERM GOAL #3   Title Pt will demonstrate horizontal abduction and adduction strength of 5/5 MMT for improved functional use of left UE.   Time 8   Period Weeks   Status New     PT LONG TERM GOAL #4   Title Pt will report 75% reduced pain during the day   Time 8   Period Weeks   Status New     PT LONG TERM GOAL #5   Title pt will be able to sleep through the night without waking due to pain   Time 8   Period Weeks   Status New                Plan - 06/13/16 1341    Clinical Impression Statement Pt presents to clinic with neck and shoulder pain onset one month ago.  Pt has pain that is constant and increased with desk work.  Pain  radiates down her left arm and wakes her at night.  Pt has left UE weakness from 4/5.  Cervical rotation decreased 45 deg bilatreally with pain to the left.  Cervical distraction test alieviates pain in arm.  Pt has muscle spasms in suboccipital, upper trap and cervical muscles.  Pt demonstrates poor posture with rounded shoulders and forward head.  She will benefit from skilled PT to address deficits in order to return to full funciton.   Clinical Presentation Stable   Clinical Presentation due to: cervical and left shoulder pain and and weakness   Clinical Decision Making Low   Rehab Potential Excellent   Clinical Impairments Affecting Rehab Potential n/a   PT Frequency 2x / week   PT Duration 8 weeks   PT Treatment/Interventions ADLs/Self Care Home Management;Biofeedback;Cryotherapy;Electrical Stimulation;Iontophoresis 4mg /ml Dexamethasone;Moist Heat;Traction;Ultrasound;Therapeutic activities;Therapeutic exercise;Neuromuscular re-education;Patient/family education;Manual  techniques;Passive range of motion;Dry needling;Taping   PT Next Visit Plan traction, manual, review posture, postural strengthening   Recommended Other Services n/a   Consulted and Agree with Plan of Care Patient      Patient will benefit from skilled therapeutic intervention in order to improve the following deficits and impairments:  Pain, Increased muscle spasms, Impaired UE functional use, Decreased strength, Postural dysfunction  Visit Diagnosis: Cervicalgia  Muscle weakness (generalized)  Acute pain of left shoulder     Problem List Patient Active Problem List   Diagnosis Date Noted  . Anemia due to chronic blood loss 12/04/2014    Zannie Cove, PT 06/13/2016, 1:54 PM  [facility] [address] [place], [place], [address] Phone: [phone]   Fax:  [phone]  Name: Julie [name] MRN: [id number] Date of Birth: [birth year]

## 2016-06-14 ENCOUNTER — Ambulatory Visit: Payer: BC Managed Care – PPO | Admitting: Registered"

## 2016-06-15 ENCOUNTER — Ambulatory Visit
Admission: RE | Admit: 2016-06-15 | Discharge: 2016-06-15 | Disposition: A | Discharge status: Home / Self Care | Payer: BC Managed Care – PPO | Source: Ambulatory Visit | Attending: Neurosurgery | Admitting: Neurosurgery

## 2016-06-15 DIAGNOSIS — R102 Pelvic and perineal pain: Secondary | ICD-10-CM | POA: Insufficient documentation

## 2016-06-15 DIAGNOSIS — M5412 Radiculopathy, cervical region: Secondary | ICD-10-CM

## 2016-06-15 DIAGNOSIS — L680 Hirsutism: Secondary | ICD-10-CM | POA: Insufficient documentation

## 2016-06-15 DIAGNOSIS — L659 Nonscarring hair loss, unspecified: Secondary | ICD-10-CM | POA: Insufficient documentation

## 2016-06-15 MED ORDER — TRIAMCINOLONE ACETONIDE 40 MG/ML IJ SUSP (RADIOLOGY)
60.0000 mg | Freq: Once | INTRAMUSCULAR | Status: AC
  Administered 2016-06-15: 60 mg via EPIDURAL

## 2016-06-15 MED ORDER — IOPAMIDOL (ISOVUE-M 300) INJECTION 61%
1.0000 mL | Freq: Once | INTRAMUSCULAR | Status: AC | PRN
  Administered 2016-06-15: 1 mL via EPIDURAL

## 2016-06-15 NOTE — Discharge Instructions (Signed)

## 2016-06-20 ENCOUNTER — Ambulatory Visit: Payer: BC Managed Care – PPO | Admitting: Physical Therapy

## 2016-06-20 ENCOUNTER — Encounter: Payer: Self-pay | Admitting: Physical Therapy

## 2016-06-20 ENCOUNTER — Ambulatory Visit: Payer: BC Managed Care – PPO | Attending: Neurosurgery | Admitting: Physical Therapy

## 2016-06-20 DIAGNOSIS — M6281 Muscle weakness (generalized): Secondary | ICD-10-CM | POA: Diagnosis present

## 2016-06-20 DIAGNOSIS — M542 Cervicalgia: Secondary | ICD-10-CM | POA: Diagnosis not present

## 2016-06-20 DIAGNOSIS — M25512 Pain in left shoulder: Secondary | ICD-10-CM

## 2016-06-20 NOTE — Patient Instructions (Signed)
Resisted External Rotation: in Neutral - Bilateral    Sit or stand, tubing in both hands, elbows at sides, bent to 90, forearms forward. Pinch shoulder blades together and rotate forearms out. Keep elbows at sides. Repeat ___20_ times per set. Do __1__ sets per session. Do __2__ sessions per day.  http://orth.exer.us/967   Copyright  VHI. All rights reserved.  Ear / Shoulder Stretch    Exhaling, move left ear toward left shoulder. Hold position for _30 sec. Bring head back to center. Repeat to other side. Repeat sequence __1_ times on each side. Do __3_ times per day.  Copyright  VHI. All rights reserved.

## 2016-06-20 NOTE — Therapy (Signed)
[facility] [address], [place], [place], [address] Phone: [phone]   Fax:  [phone]  Physical Therapy Treatment  Patient Details  Name: Julie [name] MRN: [id number] Date of Birth: 03-21-1975 Referring Provider: Consuella Lose   Encounter Date: 06/20/2016      PT End of Session - 06/20/16 0911    Visit Number 2   Date for PT Re-Evaluation 08/08/16   PT Start Time 0849   PT Stop Time 0931   PT Time Calculation (min) 42 min   Activity Tolerance Patient tolerated treatment well   Behavior During Therapy [facility] for tasks assessed/performed      Past Medical History:  Diagnosis Date  . Alopecia    h/o  . Anemia during pregnancy    history only  . Chlamydia   . Fibroadenoma of breast   . Heart murmur    as child - resolved as adult - no problem  . Hirsutism   . Menorrhagia   . Migraine   . SVD (spontaneous vaginal delivery)    x 3  . Trichomonas   . 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 Yeast infection     Past Surgical History:  Procedure Laterality Date  . CESAREAN SECTION    . DILATION AND CURETTAGE OF UTERUS    . DILITATION & CURRETTAGE/HYSTROSCOPY WITH VERSAPOINT RESECTION N/A 12/20/2012   Procedure: DILATATION & CURETTAGE/HYSTEROSCOPY WITH VERSAPOINT RESECTION;  Surgeon: Eldred Manges, MD;  Location: [facility];  Service: Gynecology;  Laterality: N/A;  . HYSTEROSCOPY    . TUMOR REMOVAL     breast - right  . VAGINAL HYSTERECTOMY Bilateral 12/03/2014   Procedure: TOTAL VAGINAL HYSTERECTOMY Bilateral Salpingectomy. aspiration of right ovarian cyst;  Surgeon: Eldred Manges, MD;  Location: [facility];  Service: Gynecology;  Laterality: Bilateral;  . VULVA /PERINEUM BIOPSY Left 12/20/2012   Procedure: VULVAR BIOPSY;  Surgeon: Eldred Manges, MD;  Location: [facility];  Service: Gynecology;  Laterality: Left;  . WISDOM TOOTH EXTRACTION      There were no vitals filed for this visit.       Subjective Assessment - 06/20/16 0852    Subjective Neck is okay and my arm is about the same.    Limitations Lifting   Currently in Pain? Yes   Pain Score 4    Pain Location Neck   Pain Orientation Left   Pain Descriptors / Indicators Sharp   Pain Type Acute pain   Pain Onset More than a month ago   Pain Frequency Constant   Aggravating Factors  sitting and typing   Pain Relieving Factors arm up and external rotation   Effect of Pain on Daily Activities wake at night a few times, lifting arm   Multiple Pain Sites No                         [facility] Adult PT Treatment/Exercise - 06/20/16 0001      Therapeutic Activites    Therapeutic Activities ADL's   ADL's cues for posture     Exercises   Exercises Shoulder     Shoulder Exercises: Supine   External Rotation Strengthening;Both;20 reps;Theraband  red band on foam roll   Other Supine Exercises pec stretch lying on foam roller     Shoulder Exercises: ROM/Strengthening   Other ROM/Strengthening Exercises pec stretch, upper trap stretch - 3x 20 sec     Modalities  Modalities Moist Heat;Traction     Moist Heat Therapy   Number Minutes Moist Heat 15 Minutes     Traction   Type of Traction Cervical   Min (lbs) 5   Max (lbs) 12   Hold Time 60   Rest Time 20   Time 15                PT Education - 06/20/16 0922    Education provided Yes   Education Details upper trap stretch, bilateral external rotation shoulder   Person(s) Educated Patient   Methods Explanation;Demonstration;Tactile cues;Verbal cues;Handout   Comprehension Verbalized understanding;Returned demonstration          PT Short Term Goals - 06/20/16 0911      PT SHORT TERM GOAL #1   Title pt will be demonstrate improved posture with sitting and standing activities for reduced pain    Time 4   Period Weeks   Status On-going     PT SHORT TERM GOAL #2   Title pt will have 25% less pain throughout the day   Time 4   Period  Weeks   Status On-going     PT SHORT TERM GOAL #3   Title pt will be independent with initial HEP   Time 4   Period Weeks   Status On-going           PT Long Term Goals - 06/13/16 3818      PT LONG TERM GOAL #1   Title pt will be independent with advanced HEP   Time 8   Period Weeks   Status New     PT LONG TERM GOAL #2   Title FOTO < or = to 28% limited   Time 8   Period Weeks   Status New     PT LONG TERM GOAL #3   Title Pt will demonstrate horizontal abduction and adduction strength of 5/5 MMT for improved functional use of left UE.   Time 8   Period Weeks   Status New     PT LONG TERM GOAL #4   Title Pt will report 75% reduced pain during the day   Time 8   Period Weeks   Status New     PT LONG TERM GOAL #5   Title pt will be able to sleep through the night without waking due to pain   Time 8   Period Weeks   Status New               Plan - 06/20/16 2993    Clinical Impression Statement Pt needed review on posture alignment.  Pt has not made progress yet due to first treatment since eval.  Pt able to perform stretches and exercises without increased pain.  Pt had good response from traction with dcreased pain post treatment.  Pt will benefit from skilled PT for improved postural strength in order to better perform job related tasks.   PT Treatment/Interventions ADLs/Self Care Home Management;Biofeedback;Cryotherapy;Electrical Stimulation;Iontophoresis 4mg /ml Dexamethasone;Moist Heat;Traction;Ultrasound;Therapeutic activities;Therapeutic exercise;Neuromuscular re-education;Patient/family education;Manual techniques;Passive range of motion;Dry needling;Taping   PT Next Visit Plan traction, manual, UE and postural strengthening   Consulted and Agree with Plan of Care Patient      Patient will benefit from skilled therapeutic intervention in order to improve the following deficits and impairments:  Pain, Increased muscle spasms, Impaired UE functional  use, Decreased strength, Postural dysfunction  Visit Diagnosis: Cervicalgia  Muscle weakness (generalized)  Acute pain of left shoulder  Problem List Patient Active Problem List   Diagnosis Date Noted  . Alopecia 06/15/2016  . Hirsutism 06/15/2016  . Pain in pelvis 06/15/2016  . Prediabetes 09/14/2015  . Recurrent candidiasis of vagina 01/22/2015  . Anemia due to chronic blood loss 12/04/2014  . History of hysterectomy 12/03/2014    Zannie Cove, PT 06/20/2016, 9:33 AM  [facility] [address], [place], [place], [address] Phone: [phone]   Fax:  [phone]  Name: Julie [name] MRN: [id number] Date of Birth: May 23, 1975

## 2016-06-27 ENCOUNTER — Ambulatory Visit: Payer: BC Managed Care – PPO | Admitting: Physical Therapy

## 2016-06-27 DIAGNOSIS — M6281 Muscle weakness (generalized): Secondary | ICD-10-CM

## 2016-06-27 DIAGNOSIS — M542 Cervicalgia: Secondary | ICD-10-CM | POA: Diagnosis not present

## 2016-06-27 DIAGNOSIS — M25512 Pain in left shoulder: Secondary | ICD-10-CM

## 2016-06-27 NOTE — Therapy (Signed)
[facility] [address], [place], [place], [address] Phone: [phone]   Fax:  [phone]  Physical Therapy Treatment  Patient Details  Name: Julie [name] MRN: [id number] Date of Birth: January 09, 1976 Referring Provider: Consuella Lose   Encounter Date: 06/27/2016      PT End of Session - 06/27/16 0915    Visit Number 3   Date for PT Re-Evaluation 08/08/16   PT Start Time 0850  pt arrived late   PT Stop Time 0930   PT Time Calculation (min) 40 min   Activity Tolerance Patient tolerated treatment well   Behavior During Therapy [facility] for tasks assessed/performed      Past Medical History:  Diagnosis Date  . Alopecia    h/o  . Anemia during pregnancy    history only  . Chlamydia   . Fibroadenoma of breast   . Heart murmur    as child - resolved as adult - no problem  . Hirsutism   . Menorrhagia   . Migraine   . SVD (spontaneous vaginal delivery)    x 3  . Trichomonas   . 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 Yeast infection     Past Surgical History:  Procedure Laterality Date  . CESAREAN SECTION    . DILATION AND CURETTAGE OF UTERUS    . DILITATION & CURRETTAGE/HYSTROSCOPY WITH VERSAPOINT RESECTION N/A 12/20/2012   Procedure: DILATATION & CURETTAGE/HYSTEROSCOPY WITH VERSAPOINT RESECTION;  Surgeon: Eldred Manges, MD;  Location: [facility];  Service: Gynecology;  Laterality: N/A;  . HYSTEROSCOPY    . TUMOR REMOVAL     breast - right  . VAGINAL HYSTERECTOMY Bilateral 12/03/2014   Procedure: TOTAL VAGINAL HYSTERECTOMY Bilateral Salpingectomy. aspiration of right ovarian cyst;  Surgeon: Eldred Manges, MD;  Location: [facility];  Service: Gynecology;  Laterality: Bilateral;  . VULVA /PERINEUM BIOPSY Left 12/20/2012   Procedure: VULVAR BIOPSY;  Surgeon: Eldred Manges, MD;  Location: [facility];  Service: Gynecology;  Laterality: Left;  . WISDOM TOOTH EXTRACTION      There were no vitals filed for  this visit.      Subjective Assessment - 06/27/16 0850    Subjective reports neck is feeling a little better; switched pain medication and that seems to be helping with PT.   Diagnostic tests MRI showed ruptured disc cervical   Patient Stated Goals pain relief and strength   Currently in Pain? Yes   Pain Score 3    Pain Location Neck   Pain Orientation Left   Pain Descriptors / Indicators Dull   Pain Type Acute pain   Pain Radiating Towards to Lt elbow   Pain Onset More than a month ago   Pain Frequency Constant   Aggravating Factors  sitting and typing   Pain Relieving Factors arm up and external rotation                         [facility] Adult PT Treatment/Exercise - 06/27/16 0001      Exercises   Exercises Neck     Neck Exercises: Theraband   Shoulder External Rotation 10 reps  yellow   Shoulder External Rotation Limitations supine with foam roll   Horizontal ABduction 10 reps  yellow   Horizontal ABduction Limitations supine with foam roll   Other Theraband Exercises supine on foam roll with yellow tband: D1/D2 x 10 reps each     Neck  Exercises: Supine   Neck Retraction 10 reps;5 secs     Shoulder Exercises: Stretch   Other Shoulder Stretches low doorway stretch 3x30 sec   Other Shoulder Stretches chest stretch on pink foam roll x 3 min     Traction   Type of Traction Cervical   Min (lbs) 5   Max (lbs) 15   Hold Time 60   Rest Time 20   Time 15                  PT Short Term Goals - 06/20/16 0911      PT SHORT TERM GOAL #1   Title pt will be demonstrate improved posture with sitting and standing activities for reduced pain    Time 4   Period Weeks   Status On-going     PT SHORT TERM GOAL #2   Title pt will have 25% less pain throughout the day   Time 4   Period Weeks   Status On-going     PT SHORT TERM GOAL #3   Title pt will be independent with initial HEP   Time 4   Period Weeks   Status On-going           PT  Long Term Goals - 06/13/16 2409      PT LONG TERM GOAL #1   Title pt will be independent with advanced HEP   Time 8   Period Weeks   Status New     PT LONG TERM GOAL #2   Title FOTO < or = to 28% limited   Time 8   Period Weeks   Status New     PT LONG TERM GOAL #3   Title Pt will demonstrate horizontal abduction and adduction strength of 5/5 MMT for improved functional use of left UE.   Time 8   Period Weeks   Status New     PT LONG TERM GOAL #4   Title Pt will report 75% reduced pain during the day   Time 8   Period Weeks   Status New     PT LONG TERM GOAL #5   Title pt will be able to sleep through the night without waking due to pain   Time 8   Period Weeks   Status New               Plan - 06/27/16 0916    Clinical Impression Statement Pt tolerated session well today and reports some centralization of symptoms now only to elbow rather than wrist.  Increased traction from 12# to 15# today.  Will continue to benefit from PT to maximize function and decrease pain.   PT Treatment/Interventions ADLs/Self Care Home Management;Biofeedback;Cryotherapy;Electrical Stimulation;Iontophoresis 4mg /ml Dexamethasone;Moist Heat;Traction;Ultrasound;Therapeutic activities;Therapeutic exercise;Neuromuscular re-education;Patient/family education;Manual techniques;Passive range of motion;Dry needling;Taping   PT Next Visit Plan traction, manual, UE and postural strengthening   Consulted and Agree with Plan of Care Patient      Patient will benefit from skilled therapeutic intervention in order to improve the following deficits and impairments:  Pain, Increased muscle spasms, Impaired UE functional use, Decreased strength, Postural dysfunction  Visit Diagnosis: Cervicalgia  Muscle weakness (generalized)  Acute pain of left shoulder     Problem List Patient Active Problem List   Diagnosis Date Noted  . Alopecia 06/15/2016  . Hirsutism 06/15/2016  . Pain in pelvis  06/15/2016  . Prediabetes 09/14/2015  . Recurrent candidiasis of vagina 01/22/2015  . Anemia due to chronic blood loss  12/04/2014  . History of hysterectomy 12/03/2014      Laureen Abrahams, PT, DPT 06/27/16 9:18 AM    [facility] [address], [place], [place], [address] Phone: [phone]   Fax:  [phone]  Name: VERTIE DIBBERN MRN: [id number] Date of Birth: 08/26/1975

## 2016-06-28 ENCOUNTER — Other Ambulatory Visit: Payer: Self-pay | Admitting: Neurosurgery

## 2016-06-28 DIAGNOSIS — M5412 Radiculopathy, cervical region: Secondary | ICD-10-CM

## 2016-06-29 ENCOUNTER — Ambulatory Visit: Payer: BC Managed Care – PPO

## 2016-06-29 DIAGNOSIS — M6281 Muscle weakness (generalized): Secondary | ICD-10-CM

## 2016-06-29 DIAGNOSIS — M25512 Pain in left shoulder: Secondary | ICD-10-CM

## 2016-06-29 DIAGNOSIS — M542 Cervicalgia: Secondary | ICD-10-CM | POA: Diagnosis not present

## 2016-06-29 NOTE — Therapy (Signed)
[facility] [address] [place], [place], [address] Phone: [phone]   Fax:  [phone]  Physical Therapy Treatment  Patient Details  Name: Julie [name] MRN: [id number] Date of Birth: Oct 24, 1975 Referring Provider: Consuella Lose   Encounter Date: 06/29/2016      PT End of Session - 06/29/16 0829    Visit Number 4   Date for PT Re-Evaluation 08/08/16   PT Start Time 0803   PT Stop Time 0842   PT Time Calculation (min) 39 min   Activity Tolerance Patient tolerated treatment well   Behavior During Therapy [facility] for tasks assessed/performed      Past Medical History:  Diagnosis Date  . Alopecia    h/o  . Anemia during pregnancy    history only  . Chlamydia   . Fibroadenoma of breast   . Heart murmur    as child - resolved as adult - no problem  . Hirsutism   . Menorrhagia   . Migraine   . SVD (spontaneous vaginal delivery)    x 3  . Trichomonas   . 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 Yeast infection     Past Surgical History:  Procedure Laterality Date  . CESAREAN SECTION    . DILATION AND CURETTAGE OF UTERUS    . DILITATION & CURRETTAGE/HYSTROSCOPY WITH VERSAPOINT RESECTION N/A 12/20/2012   Procedure: DILATATION & CURETTAGE/HYSTEROSCOPY WITH VERSAPOINT RESECTION;  Surgeon: Eldred Manges, MD;  Location: [facility];  Service: Gynecology;  Laterality: N/A;  . HYSTEROSCOPY    . TUMOR REMOVAL     breast - right  . VAGINAL HYSTERECTOMY Bilateral 12/03/2014   Procedure: TOTAL VAGINAL HYSTERECTOMY Bilateral Salpingectomy. aspiration of right ovarian cyst;  Surgeon: Eldred Manges, MD;  Location: [facility];  Service: Gynecology;  Laterality: Bilateral;  . VULVA /PERINEUM BIOPSY Left 12/20/2012   Procedure: VULVAR BIOPSY;  Surgeon: Eldred Manges, MD;  Location: [facility];  Service: Gynecology;  Laterality: Left;  . WISDOM TOOTH EXTRACTION      There were no vitals filed for this visit.      Subjective Assessment - 06/29/16 0812    Subjective Pt reports 40% overall improvement.  Traction is helping.     Pertinent History Cervical disc replacement scheduled for 07/13/16   Currently in Pain? Yes   Pain Score 3    Pain Location Neck   Pain Orientation Left                         [facility] Adult PT Treatment/Exercise - 06/29/16 0001      Shoulder Exercises: Standing   External Rotation Strengthening;Both;20 reps;Theraband   Theraband Level (Shoulder External Rotation) Level 1 (Yellow)   Extension Strengthening;Both;20 reps;Theraband   Theraband Level (Shoulder Extension) Level 1 (Yellow)   Row Strengthening;Both;20 reps;Theraband   Theraband Level (Shoulder Row) Level 1 (Yellow)     Traction   Type of Traction Cervical   Min (lbs) 5   Max (lbs) 15   Hold Time 60   Rest Time 20   Time 15                  PT Short Term Goals - 06/29/16 0807      PT SHORT TERM GOAL #1   Title pt will be demonstrate improved posture with sitting and standing activities for reduced pain    Status Achieved     PT  SHORT TERM GOAL #2   Title pt will have 25% less pain throughout the day   Baseline 40% improvement   Status Achieved     PT SHORT TERM GOAL #3   Title pt will be independent with initial HEP   Time 4   Period Weeks   Status On-going           PT Long Term Goals - 06/13/16 1610      PT LONG TERM GOAL #1   Title pt will be independent with advanced HEP   Time 8   Period Weeks   Status New     PT LONG TERM GOAL #2   Title FOTO < or = to 28% limited   Time 8   Period Weeks   Status New     PT LONG TERM GOAL #3   Title Pt will demonstrate horizontal abduction and adduction strength of 5/5 MMT for improved functional use of left UE.   Time 8   Period Weeks   Status New     PT LONG TERM GOAL #4   Title Pt will report 75% reduced pain during the day   Time 8   Period Weeks   Status New     PT LONG TERM GOAL #5   Title pt will  be able to sleep through the night without waking due to pain   Time 8   Period Weeks   Status New               Plan - 06/29/16 9604    Clinical Impression Statement Pt reports that she has surgery scheduled for disc replacement.  40% overall improvement in symptoms with treatment.  Posture is improved with fewer verbal cues needed with exercise.  Pt will continue to benefit from PT for postural strength and traction to centralize Lt UE symptoms.     Rehab Potential Excellent   PT Frequency 2x / week   PT Duration 8 weeks   PT Treatment/Interventions ADLs/Self Care Home Management;Biofeedback;Cryotherapy;Electrical Stimulation;Iontophoresis 4mg /ml Dexamethasone;Moist Heat;Traction;Ultrasound;Therapeutic activities;Therapeutic exercise;Neuromuscular re-education;Patient/family education;Manual techniques;Passive range of motion;Dry needling;Taping   PT Next Visit Plan traction, manual, UE and postural strengthening   Consulted and Agree with Plan of Care Patient      Patient will benefit from skilled therapeutic intervention in order to improve the following deficits and impairments:  Pain, Increased muscle spasms, Impaired UE functional use, Decreased strength, Postural dysfunction  Visit Diagnosis: Cervicalgia  Muscle weakness (generalized)  Acute pain of left shoulder     Problem List Patient Active Problem List   Diagnosis Date Noted  . Alopecia 06/15/2016  . Hirsutism 06/15/2016  . Pain in pelvis 06/15/2016  . Prediabetes 09/14/2015  . Recurrent candidiasis of vagina 01/22/2015  . Anemia due to chronic blood loss 12/04/2014  . History of hysterectomy 12/03/2014    Sigurd Sos, PT 06/29/16 8:31 AM   [facility] [address] [place], [place], [address] Phone: [phone]   Fax:  [phone]  Name: Julie [name] MRN: [id number] Date of Birth: December 13, 1975

## 2016-07-05 ENCOUNTER — Ambulatory Visit
Admission: RE | Admit: 2016-07-05 | Discharge: 2016-07-05 | Disposition: A | Discharge status: Home / Self Care | Payer: BC Managed Care – PPO | Source: Ambulatory Visit | Attending: Neurosurgery | Admitting: Neurosurgery

## 2016-07-05 DIAGNOSIS — M5412 Radiculopathy, cervical region: Secondary | ICD-10-CM

## 2016-07-05 MED ORDER — TRIAMCINOLONE ACETONIDE 40 MG/ML IJ SUSP (RADIOLOGY)
60.0000 mg | Freq: Once | INTRAMUSCULAR | Status: AC
  Administered 2016-07-05: 60 mg via EPIDURAL

## 2016-07-05 MED ORDER — IOPAMIDOL (ISOVUE-M 300) INJECTION 61%
1.0000 mL | Freq: Once | INTRAMUSCULAR | Status: AC | PRN
  Administered 2016-07-05: 1 mL via EPIDURAL

## 2016-07-05 NOTE — Discharge Instructions (Signed)

## 2016-07-06 ENCOUNTER — Ambulatory Visit: Payer: BC Managed Care – PPO

## 2016-07-06 DIAGNOSIS — M6281 Muscle weakness (generalized): Secondary | ICD-10-CM

## 2016-07-06 DIAGNOSIS — M542 Cervicalgia: Secondary | ICD-10-CM | POA: Diagnosis not present

## 2016-07-06 DIAGNOSIS — M25512 Pain in left shoulder: Secondary | ICD-10-CM

## 2016-07-06 NOTE — Therapy (Signed)
[facility] [address], [place], [place], [address] Phone: [phone]   Fax:  [phone]  Physical Therapy Treatment  Patient Details  Name: Julie [name] MRN: [id number] Date of Birth: Oct 21, 1975 Referring Provider: Consuella Lose   Encounter Date: 07/06/2016      PT End of Session - 07/06/16 0925    Visit Number 5   Date for PT Re-Evaluation 08/08/16   PT Start Time 0853   PT Stop Time 0937   PT Time Calculation (min) 44 min   Activity Tolerance Patient tolerated treatment well   Behavior During Therapy [facility] for tasks assessed/performed      Past Medical History:  Diagnosis Date  . Alopecia    h/o  . Anemia during pregnancy    history only  . Chlamydia   . Fibroadenoma of breast   . Heart murmur    as child - resolved as adult - no problem  . Hirsutism   . Menorrhagia   . Migraine   . SVD (spontaneous vaginal delivery)    x 3  . Trichomonas   . 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 Yeast infection     Past Surgical History:  Procedure Laterality Date  . CESAREAN SECTION    . DILATION AND CURETTAGE OF UTERUS    . DILITATION & CURRETTAGE/HYSTROSCOPY WITH VERSAPOINT RESECTION N/A 12/20/2012   Procedure: DILATATION & CURETTAGE/HYSTEROSCOPY WITH VERSAPOINT RESECTION;  Surgeon: Eldred Manges, MD;  Location: [facility];  Service: Gynecology;  Laterality: N/A;  . HYSTEROSCOPY    . TUMOR REMOVAL     breast - right  . VAGINAL HYSTERECTOMY Bilateral 12/03/2014   Procedure: TOTAL VAGINAL HYSTERECTOMY Bilateral Salpingectomy. aspiration of right ovarian cyst;  Surgeon: Eldred Manges, MD;  Location: [facility];  Service: Gynecology;  Laterality: Bilateral;  . VULVA /PERINEUM BIOPSY Left 12/20/2012   Procedure: VULVAR BIOPSY;  Surgeon: Eldred Manges, MD;  Location: [facility];  Service: Gynecology;  Laterality: Left;  . WISDOM TOOTH EXTRACTION      There were no vitals filed for this visit.      Subjective Assessment - 07/06/16 0855    Subjective 50% overall improvement.  Pt had injection yesterday.  This is 2nd injection and I feel like it helped.  Didn't have to take medicine.  Pt is going to postpone surgery as they want her to try other treatments first including injections and therapy.     Pertinent History Cervical disc replacement scheduled for 07/13/16   Currently in Pain? Yes   Pain Score 2    Pain Location Neck   Pain Orientation Left   Pain Descriptors / Indicators Dull   Pain Type Acute pain   Pain Onset More than a month ago   Pain Frequency Constant   Aggravating Factors  sitting and typing   Pain Relieving Factors arm up and external rotation                         [facility] Adult PT Treatment/Exercise - 07/06/16 0001      Neck Exercises: Machines for Strengthening   UBE (Upper Arm Bike) Level 1x 6 minutes (3/3)     Shoulder Exercises: Supine   Horizontal ABduction Strengthening;Both;20 reps;Theraband   Theraband Level (Shoulder Horizontal ABduction) Level 2 (Red)   External Rotation Strengthening;Both;20 reps   Theraband Level (Shoulder External Rotation) Level 2 (Red)   Flexion Strengthening;Both;20 reps;Theraband  Theraband Level (Shoulder Flexion) Level 2 (Red)   Other Supine Exercises D2 with red band:      Traction   Type of Traction Cervical   Min (lbs) 5   Max (lbs) 15   Hold Time 60   Rest Time 20   Time 15                PT Education - 07/06/16 0915    Education provided Yes   Education Details supine theraband   Person(s) Educated Patient   Methods Explanation;Demonstration;Handout   Comprehension Verbalized understanding;Returned demonstration          PT Short Term Goals - 06/29/16 0807      PT SHORT TERM GOAL #1   Title pt will be demonstrate improved posture with sitting and standing activities for reduced pain    Status Achieved     PT SHORT TERM GOAL #2   Title pt will have 25% less pain  throughout the day   Baseline 40% improvement   Status Achieved     PT SHORT TERM GOAL #3   Title pt will be independent with initial HEP   Time 4   Period Weeks   Status On-going           PT Long Term Goals - 07/06/16 0857      PT LONG TERM GOAL #1   Title pt will be independent with advanced HEP   Time 8   Period Weeks   Status On-going     PT LONG TERM GOAL #2   Title FOTO < or = to 28% limited   Time 8   Period Weeks   Status On-going     PT LONG TERM GOAL #4   Title Pt will report 75% reduced pain during the day   Baseline 50% better   Time 8   Period Weeks   Status On-going     PT LONG TERM GOAL #5   Title pt will be able to sleep through the night without waking due to pain   Baseline 90% better   Time 8   Period Weeks   Status On-going               Plan - 07/06/16 0904    Clinical Impression Statement Pt will have to postpone surgery due to insurance for now.  Pt reports 50% overall improvement in symptoms since the start of care and 90% improvement in sleep.  Pt with improved posture and PT issued new exercises to improve postural strength.  Pt responded well to traction today.  Pt will continue to benefit from skilled PT for strength, postural corrections and traction.     Rehab Potential Excellent   PT Frequency 2x / week   PT Duration 8 weeks   PT Treatment/Interventions ADLs/Self Care Home Management;Biofeedback;Cryotherapy;Electrical Stimulation;Iontophoresis 4mg /ml Dexamethasone;Moist Heat;Traction;Ultrasound;Therapeutic activities;Therapeutic exercise;Neuromuscular re-education;Patient/family education;Manual techniques;Passive range of motion;Dry needling;Taping   PT Next Visit Plan traction, manual, UE and postural strengthening   Consulted and Agree with Plan of Care Patient      Patient will benefit from skilled therapeutic intervention in order to improve the following deficits and impairments:  Pain, Increased muscle spasms,  Impaired UE functional use, Decreased strength, Postural dysfunction  Visit Diagnosis: Cervicalgia  Muscle weakness (generalized)  Acute pain of left shoulder     Problem List Patient Active Problem List   Diagnosis Date Noted  . Alopecia 06/15/2016  . Hirsutism 06/15/2016  . Pain in pelvis 06/15/2016  .  Prediabetes 09/14/2015  . Recurrent candidiasis of vagina 01/22/2015  . Anemia due to chronic blood loss 12/04/2014  . History of hysterectomy 12/03/2014    Sigurd Sos, PT 07/06/16 9:29 AM  [facility] [address], [place], [place], [address] Phone: [phone]   Fax:  [phone]  Name: Julie [name] MRN: [id number] Date of Birth: Dec 29, 1975

## 2016-07-06 NOTE — Patient Instructions (Addendum)
Over Head Pull: Narrow Grip       On back, knees bent, feet flat, band across thighs, elbows straight but relaxed. Pull hands apart (start). Keeping elbows straight, bring arms up and over head, hands toward floor. Keep pull steady on band. Hold momentarily. Return slowly, keeping pull steady, back to start. Repeat __10_ times. Band color _yellow____   Side Pull: Double Arm   On back, knees bent, feet flat. Arms perpendicular to body, shoulder level, elbows straight but relaxed. Pull arms out to sides, elbows straight. Resistance band comes across collarbones, hands toward floor. Hold momentarily. Slowly return to starting position. Repeat _10__ times. Band color _yellow____   Sash   On back, knees bent, feet flat, left hand on left hip, right hand above left. Pull right arm DIAGONALLY (hip to shoulder) across chest. Bring right arm along head toward floor. Hold momentarily. Slowly return to starting position. Repeat __10_ times. Do with left arm. Band color ___yellow___   Shoulder Rotation: Double Arm   On back, knees bent, feet flat, elbows tucked at sides, bent 90, hands palms up. Pull hands apart and down toward floor, keeping elbows near sides. Hold momentarily. Slowly return to starting position. Repeat _10__ times. Band color __yellow    [facility] [address], [address] [place], [place] [address] Phone # [phone] Fax [phone]____   

## 2016-07-11 ENCOUNTER — Ambulatory Visit: Payer: BC Managed Care – PPO

## 2016-07-11 DIAGNOSIS — M542 Cervicalgia: Secondary | ICD-10-CM

## 2016-07-11 DIAGNOSIS — M25512 Pain in left shoulder: Secondary | ICD-10-CM

## 2016-07-11 DIAGNOSIS — M6281 Muscle weakness (generalized): Secondary | ICD-10-CM

## 2016-07-11 NOTE — Therapy (Signed)
[facility] [address] [place], [place], [address] Phone: [phone]   Fax:  [phone]  Physical Therapy Treatment  Patient Details  Name: Julie [name] MRN: [id number] Date of Birth: [birth year] Referring Provider: Consuella Lose   Encounter Date: 07/11/2016      PT End of Session - 07/11/16 0920    Visit Number 6   Date for PT Re-Evaluation 08/08/16   PT Start Time 0850   PT Stop Time 0935   PT Time Calculation (min) 45 min   Activity Tolerance Patient tolerated treatment well   Behavior During Therapy [facility] for tasks assessed/performed      Past Medical History:  Diagnosis Date  . Alopecia    h/o  . Anemia during pregnancy    history only  . Chlamydia   . Fibroadenoma of breast   . Heart murmur    as child - resolved as adult - no problem  . Hirsutism   . Menorrhagia   . Migraine   . SVD (spontaneous vaginal delivery)    x 3  . Trichomonas   . 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 Yeast infection     Past Surgical History:  Procedure Laterality Date  . CESAREAN SECTION    . DILATION AND CURETTAGE OF UTERUS    . DILITATION & CURRETTAGE/HYSTROSCOPY WITH VERSAPOINT RESECTION N/A 12/20/2012   Procedure: DILATATION & CURETTAGE/HYSTEROSCOPY WITH VERSAPOINT RESECTION;  Surgeon: Eldred Manges, MD;  Location: [facility];  Service: Gynecology;  Laterality: N/A;  . HYSTEROSCOPY    . TUMOR REMOVAL     breast - right  . VAGINAL HYSTERECTOMY Bilateral 12/03/2014   Procedure: TOTAL VAGINAL HYSTERECTOMY Bilateral Salpingectomy. aspiration of right ovarian cyst;  Surgeon: Eldred Manges, MD;  [facility];  Service: Gynecology;  Laterality: Bilateral;  . VULVA /PERINEUM BIOPSY Left 12/20/2012   Procedure: VULVAR BIOPSY;  Surgeon: Eldred Manges, MD;  Location: [facility];  Service: Gynecology;  Laterality: Left;  . WISDOM TOOTH EXTRACTION      There were no vitals filed for this visit.      Subjective Assessment - 07/11/16 0852    Subjective The pain seems to be more in my shoulder than my arm.  Injection last week.     Pertinent History Cervical disc replacement surgery postponed as insurance wants her to try PT and injection.    Currently in Pain? Yes   Pain Score 3    Pain Location Neck   Pain Orientation Left   Pain Descriptors / Indicators Dull   Pain Type Chronic pain   Pain Radiating Towards Lt shoulder   Pain Onset More than a month ago   Pain Frequency Constant   Aggravating Factors  sitting and typing   Pain Relieving Factors medication            [facility] PT Assessment - 07/11/16 0001      Observation/Other Assessments   Focus on Therapeutic Outcomes (FOTO)  30% limitation     Strength   Left Shoulder Flexion 4+/5   Left Shoulder Extension 5/5   Left Shoulder ABduction 4+/5   Left Shoulder Internal Rotation 5/5   Left Shoulder External Rotation 4+/5   Left Shoulder Horizontal ABduction 4/5   Left Shoulder Horizontal ADduction 4+/5                     [facility] Adult PT Treatment/Exercise - 07/11/16 0001  Neck Exercises: Machines for Strengthening   UBE (Upper Arm Bike) Level 1x 6 minutes (3/3)     Shoulder Exercises: Supine   Horizontal ABduction Strengthening;Both;20 reps;Theraband   Theraband Level (Shoulder Horizontal ABduction) Level 2 (Red)   External Rotation Strengthening;Both;20 reps   Theraband Level (Shoulder External Rotation) Level 2 (Red)   Flexion Strengthening;Both;20 reps;Theraband   Theraband Level (Shoulder Flexion) Level 2 (Red)   Other Supine Exercises D2 with red band:      Traction   Type of Traction Cervical   Min (lbs) 5   Max (lbs) 16   Hold Time 60   Rest Time 20   Time 15     Neck Exercises: Stretches   Upper Trapezius Stretch 3 reps;20 seconds                  PT Short Term Goals - 07/11/16 0855      PT SHORT TERM GOAL #3   Title pt will be independent with initial HEP   Status  Achieved           PT Long Term Goals - 07/11/16 0855      PT LONG TERM GOAL #1   Title pt will be independent with advanced HEP   Time 8   Period Weeks   Status On-going     PT LONG TERM GOAL #2   Title FOTO < or = to 28% limited   Baseline 30%   Time 8   Period Weeks   Status On-going     PT LONG TERM GOAL #3   Title Pt will demonstrate horizontal abduction and adduction strength of 5/5 MMT for improved functional use of left UE.   Baseline 4/5, 4+/5   Time 8   Period Weeks   Status On-going     PT LONG TERM GOAL #4   Title Pt will report 75% reduced pain during the day   Baseline 50% better   Time 8   Status On-going     PT LONG TERM GOAL #5   Title pt will be able to sleep through the night without waking due to pain   Baseline 90% better   Time 8   Period Weeks   Status On-going               Plan - 07/11/16 0908    Clinical Impression Statement Pt reports that pain is less frequent and she has days where she doesn't have any pain.  Symptoms are begining to centralize to the Lt shoulder.  Pt is independent and compliant with supine HEP.  Pt with reduced symptoms with traction.  Pt will continue to benefit from skilled PT for postural strength, endurance, and traction.     Rehab Potential Excellent   PT Frequency 2x / week   PT Duration 8 weeks   PT Treatment/Interventions ADLs/Self Care Home Management;Biofeedback;Cryotherapy;Electrical Stimulation;Iontophoresis 4mg /ml Dexamethasone;Moist Heat;Traction;Ultrasound;Therapeutic activities;Therapeutic exercise;Neuromuscular re-education;Patient/family education;Manual techniques;Passive range of motion;Dry needling;Taping   PT Next Visit Plan traction, manual, UE and postural strengthening   Recommended Other Services initial certification is signed.     Consulted and Agree with Plan of Care Patient      Patient will benefit from skilled therapeutic intervention in order to improve the following  deficits and impairments:  Pain, Increased muscle spasms, Impaired UE functional use, Decreased strength, Postural dysfunction  Visit Diagnosis: Cervicalgia  Muscle weakness (generalized)  Acute pain of left shoulder     Problem List Patient Active Problem  List   Diagnosis Date Noted  . Alopecia 06/15/2016  . Hirsutism 06/15/2016  . Pain in pelvis 06/15/2016  . Prediabetes 09/14/2015  . Recurrent candidiasis of vagina 01/22/2015  . Anemia due to chronic blood loss 12/04/2014  . History of hysterectomy 12/03/2014   Sigurd Sos, PT 07/11/16 9:22 AM  [facility] [address] [place], [place], [address] Phone: [phone]   Fax:  [phone]  Name: MAJESTY STEHLIN MRN: [id number] Date of Birth: [birth year]

## 2016-07-13 ENCOUNTER — Ambulatory Visit: Payer: BC Managed Care – PPO

## 2016-07-13 DIAGNOSIS — M6281 Muscle weakness (generalized): Secondary | ICD-10-CM

## 2016-07-13 DIAGNOSIS — M542 Cervicalgia: Secondary | ICD-10-CM

## 2016-07-13 DIAGNOSIS — M25512 Pain in left shoulder: Secondary | ICD-10-CM

## 2016-07-13 NOTE — Therapy (Signed)
[facility] [address] [place], [place], [address] Phone: [phone]   Fax:  [phone]  Physical Therapy Treatment  Patient Details  Name: Julie [name] MRN: [id number] Date of Birth: 07/25/1975 Referring Provider: Consuella Lose   Encounter Date: 07/13/2016      PT End of Session - 07/13/16 0922    Visit Number 7   Date for PT Re-Evaluation 08/08/16   PT Start Time 0848   PT Stop Time 0935   PT Time Calculation (min) 47 min   Activity Tolerance Patient tolerated treatment well   Behavior During Therapy [facility] for tasks assessed/performed      Past Medical History:  Diagnosis Date  . Alopecia    h/o  . Anemia during pregnancy    history only  . Chlamydia   . Fibroadenoma of breast   . Heart murmur    as child - resolved as adult - no problem  . Hirsutism   . Menorrhagia   . Migraine   . SVD (spontaneous vaginal delivery)    x 3  . Trichomonas   . 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 Yeast infection     Past Surgical History:  Procedure Laterality Date  . CESAREAN SECTION    . DILATION AND CURETTAGE OF UTERUS    . DILITATION & CURRETTAGE/HYSTROSCOPY WITH VERSAPOINT RESECTION N/A 12/20/2012   Procedure: DILATATION & CURETTAGE/HYSTEROSCOPY WITH VERSAPOINT RESECTION;  Surgeon: Eldred Manges, MD;  Location: [facility];  Service: Gynecology;  Laterality: N/A;  . HYSTEROSCOPY    . TUMOR REMOVAL     breast - right  . VAGINAL HYSTERECTOMY Bilateral 12/03/2014   Procedure: TOTAL VAGINAL HYSTERECTOMY Bilateral Salpingectomy. aspiration of right ovarian cyst;  Surgeon: Eldred Manges, MD;  Location: [facility];  Service: Gynecology;  Laterality: Bilateral;  . VULVA /PERINEUM BIOPSY Left 12/20/2012   Procedure: VULVAR BIOPSY;  Surgeon: Eldred Manges, MD;  Location: [facility];  Service: Gynecology;  Laterality: Left;  . WISDOM TOOTH EXTRACTION      There were no vitals filed for this visit.      Subjective Assessment - 07/13/16 0855    Subjective Feeling better.  Taking my medication.     Currently in Pain? Yes   Pain Score 2    Pain Location Neck   Pain Orientation Left   Pain Descriptors / Indicators Dull                         [facility] Adult PT Treatment/Exercise - 07/13/16 0001      Neck Exercises: Machines for Strengthening   UBE (Upper Arm Bike) Level 1x 6 minutes (3/3)     Neck Exercises: Standing   Other Standing Exercises snow angels on wall: 2x10     Shoulder Exercises: Seated   External Rotation Strengthening;Both;20 reps;Theraband   Theraband Level (Shoulder External Rotation) Level 2 (Red)   Flexion Strengthening;Both;20 reps;Weights   Flexion Weight (lbs) 1   Abduction Strengthening;Both;20 reps;Weights   ABduction Weight (lbs) 1   ABduction Limitations scaption 2x10 with 1#     Traction   Type of Traction Cervical   Min (lbs) 5   Max (lbs) 16   Hold Time 60   Rest Time 20   Time 15     Neck Exercises: Stretches   Upper Trapezius Stretch 3 reps;20 seconds  PT Short Term Goals - 07/11/16 0855      PT SHORT TERM GOAL #3   Title pt will be independent with initial HEP   Status Achieved           PT Long Term Goals - 07/11/16 0855      PT LONG TERM GOAL #1   Title pt will be independent with advanced HEP   Time 8   Period Weeks   Status On-going     PT LONG TERM GOAL #2   Title FOTO < or = to 28% limited   Baseline 30%   Time 8   Period Weeks   Status On-going     PT LONG TERM GOAL #3   Title Pt will demonstrate horizontal abduction and adduction strength of 5/5 MMT for improved functional use of left UE.   Baseline 4/5, 4+/5   Time 8   Period Weeks   Status On-going     PT LONG TERM GOAL #4   Title Pt will report 75% reduced pain during the day   Baseline 50% better   Time 8   Status On-going     PT LONG TERM GOAL #5   Title pt will be able to sleep through the night without  waking due to pain   Baseline 90% better   Time 8   Period Weeks   Status On-going               Plan - 07/13/16 4917    Clinical Impression Statement Pt with reduced frequency and intensity of pain and pain has become more central.  Pt is working on postural corrections at home and work and stretching more frequently.  Pt will continue to benefit from skilled PT for postural strength, flexibility and traction.   Rehab Potential Excellent   PT Frequency 2x / week   PT Duration 8 weeks   PT Treatment/Interventions ADLs/Self Care Home Management;Biofeedback;Cryotherapy;Electrical Stimulation;Iontophoresis 4mg /ml Dexamethasone;Moist Heat;Traction;Ultrasound;Therapeutic activities;Therapeutic exercise;Neuromuscular re-education;Patient/family education;Manual techniques;Passive range of motion;Dry needling;Taping   PT Next Visit Plan traction, manual, UE and postural strengthening   Consulted and Agree with Plan of Care Patient      Patient will benefit from skilled therapeutic intervention in order to improve the following deficits and impairments:  Pain, Increased muscle spasms, Impaired UE functional use, Decreased strength, Postural dysfunction  Visit Diagnosis: Cervicalgia  Muscle weakness (generalized)  Acute pain of left shoulder     Problem List Patient Active Problem List   Diagnosis Date Noted  . Alopecia 06/15/2016  . Hirsutism 06/15/2016  . Pain in pelvis 06/15/2016  . Prediabetes 09/14/2015  . Recurrent candidiasis of vagina 01/22/2015  . Anemia due to chronic blood loss 12/04/2014  . History of hysterectomy 12/03/2014    TAKACS,KELLY 07/13/2016, 9:23 AM  [facility] [address], [place], [place], [address] Phone: [phone]   Fax:  [phone]  Name: Julie [name] MRN: [id number] Date of Birth: 1975/05/13

## 2016-07-18 ENCOUNTER — Ambulatory Visit: Payer: BC Managed Care – PPO | Attending: Neurosurgery

## 2016-07-18 DIAGNOSIS — M25512 Pain in left shoulder: Secondary | ICD-10-CM | POA: Diagnosis present

## 2016-07-18 DIAGNOSIS — M6281 Muscle weakness (generalized): Secondary | ICD-10-CM | POA: Insufficient documentation

## 2016-07-18 DIAGNOSIS — M542 Cervicalgia: Secondary | ICD-10-CM | POA: Insufficient documentation

## 2016-07-18 NOTE — Therapy (Signed)
[facility] [address], [place], [place], [address] Phone: [phone]   Fax:  [phone]  Physical Therapy Treatment  Patient Details  Name: RIAN KOON MRN: [id number] Date of Birth: 09/28/1975 Referring Provider: Consuella Lose   Encounter Date: 07/18/2016      PT End of Session - 07/18/16 0918    Visit Number 8   Date for PT Re-Evaluation 08/08/16   PT Start Time 0848   PT Stop Time 0929   PT Time Calculation (min) 41 min   Activity Tolerance Patient tolerated treatment well   Behavior During Therapy [facility] for tasks assessed/performed      Past Medical History:  Diagnosis Date  . Alopecia    h/o  . Anemia during pregnancy    history only  . Chlamydia   . Fibroadenoma of breast   . Heart murmur    as child - resolved as adult - no problem  . Hirsutism   . Menorrhagia   . Migraine   . SVD (spontaneous vaginal delivery)    x 3  . Trichomonas   . 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 Yeast infection     Past Surgical History:  Procedure Laterality Date  . CESAREAN SECTION    . DILATION AND CURETTAGE OF UTERUS    . DILITATION & CURRETTAGE/HYSTROSCOPY WITH VERSAPOINT RESECTION N/A 12/20/2012   Procedure: DILATATION & CURETTAGE/HYSTEROSCOPY WITH VERSAPOINT RESECTION;  Surgeon: Eldred Manges, MD;  Location: [facility];  Service: Gynecology;  Laterality: N/A;  . HYSTEROSCOPY    . TUMOR REMOVAL     breast - right  . VAGINAL HYSTERECTOMY Bilateral 12/03/2014   Procedure: TOTAL VAGINAL HYSTERECTOMY Bilateral Salpingectomy. aspiration of right ovarian cyst;  Surgeon: Eldred Manges, MD;  Location: [facility];  Service: Gynecology;  Laterality: Bilateral;  . VULVA /PERINEUM BIOPSY Left 12/20/2012   Procedure: VULVAR BIOPSY;  Surgeon: Eldred Manges, MD;  Location: [facility];  Service: Gynecology;  Laterality: Left;  . WISDOM TOOTH EXTRACTION      There were no vitals filed for this visit.       Subjective Assessment - 07/18/16 0850    Pertinent History Cervical disc replacement surgery postponed as insurance wants her to try PT and injection.    Currently in Pain? No/denies                         [facility] Adult PT Treatment/Exercise - 07/18/16 0001      Neck Exercises: Machines for Strengthening   UBE (Upper Arm Bike) Level 1x 6 minutes (3/3)     Neck Exercises: Standing   Other Standing Exercises snow angels on wall: 2x10     Shoulder Exercises: Seated   Horizontal ABduction Strengthening;20 reps;Theraband   Theraband Level (Shoulder Horizontal ABduction) Level 3 (Green)   External Rotation Strengthening;20 reps   Theraband Level (Shoulder External Rotation) Level 3 (Green)   Flexion Strengthening;20 reps   Theraband Level (Shoulder Flexion) Level 3 (Green)  D2 against the wall     Traction   Type of Traction Cervical   Min (lbs) 5   Max (lbs) 16   Hold Time 60   Rest Time 20   Time 15                  PT Short Term Goals - 07/11/16 0855      PT SHORT TERM GOAL #3   Title  pt will be independent with initial HEP   Status Achieved           PT Long Term Goals - 07/18/16 6333      PT LONG TERM GOAL #1   Title pt will be independent with advanced HEP   Time 8   Period Weeks   Status On-going     PT LONG TERM GOAL #2   Title FOTO < or = to 28% limited   Baseline 30%   Time 8   Period Weeks   Status On-going     PT LONG TERM GOAL #4   Title Pt will report 75% reduced pain during the day   Baseline 60% better   Time 8   Period Weeks   Status On-going     PT LONG TERM GOAL #5   Title pt will be able to sleep through the night without waking due to pain   Baseline 90% better   Period Weeks   Status On-going               Plan - 07/18/16 0857    Clinical Impression Statement Pt reports 60% overall improvement in symptoms since the start of care.  Pt is making continued postural corrections at home and work.   Pt tolerated advancement of exercises with green band.  Pt with centralization of UE radiculopathy with treatment.  Pt wil continue to benefit from skilled PT for postural strength, flexibility and endurance.   Rehab Potential Excellent   PT Frequency 2x / week   PT Duration 8 weeks   PT Treatment/Interventions ADLs/Self Care Home Management;Biofeedback;Cryotherapy;Electrical Stimulation;Iontophoresis 4mg /ml Dexamethasone;Moist Heat;Traction;Ultrasound;Therapeutic activities;Therapeutic exercise;Neuromuscular re-education;Patient/family education;Manual techniques;Passive range of motion;Dry needling;Taping   PT Next Visit Plan traction, manual, UE and postural strengthening   Consulted and Agree with Plan of Care Patient      Patient will benefit from skilled therapeutic intervention in order to improve the following deficits and impairments:  Pain, Increased muscle spasms, Impaired UE functional use, Decreased strength, Postural dysfunction  Visit Diagnosis: Cervicalgia  Muscle weakness (generalized)  Acute pain of left shoulder     Problem List Patient Active Problem List   Diagnosis Date Noted  . Alopecia 06/15/2016  . Hirsutism 06/15/2016  . Pain in pelvis 06/15/2016  . Prediabetes 09/14/2015  . Recurrent candidiasis of vagina 01/22/2015  . Anemia due to chronic blood loss 12/04/2014  . History of hysterectomy 12/03/2014     Sigurd Sos, PT 07/18/16 9:24 AM  [facility] [address], [place], [place], [address] Phone: [phone]   Fax:  [phone]  Name: EISA CONAWAY MRN: [id number] Date of Birth: October 20, 1975

## 2016-07-20 ENCOUNTER — Ambulatory Visit: Payer: BC Managed Care – PPO

## 2016-07-20 DIAGNOSIS — M6281 Muscle weakness (generalized): Secondary | ICD-10-CM

## 2016-07-20 DIAGNOSIS — M542 Cervicalgia: Secondary | ICD-10-CM

## 2016-07-20 DIAGNOSIS — M25512 Pain in left shoulder: Secondary | ICD-10-CM

## 2016-07-20 NOTE — Therapy (Addendum)
[facility] [address] [place], [place], [address] Phone: [phone]   Fax:  [phone]  Physical Therapy Treatment  Patient Details  Name: Julie [name] MRN: [id number] Date of Birth: 1975-02-02 Referring Provider: Consuella Lose   Encounter Date: 07/20/2016      PT End of Session - 07/20/16 0921    Visit Number 9   Date for PT Re-Evaluation 08/08/16   PT Start Time 0848   PT Stop Time 0934   PT Time Calculation (min) 46 min   Activity Tolerance Patient tolerated treatment well   Behavior During Therapy [facility] for tasks assessed/performed      Past Medical History:  Diagnosis Date  . Alopecia    h/o  . Anemia during pregnancy    history only  . Chlamydia   . Fibroadenoma of breast   . Heart murmur    as child - resolved as adult - no problem  . Hirsutism   . Menorrhagia   . Migraine   . SVD (spontaneous vaginal delivery)    x 3  . Trichomonas   . 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 Yeast infection     Past Surgical History:  Procedure Laterality Date  . CESAREAN SECTION    . DILATION AND CURETTAGE OF UTERUS    . DILITATION & CURRETTAGE/HYSTROSCOPY WITH VERSAPOINT RESECTION N/A 12/20/2012   Procedure: DILATATION & CURETTAGE/HYSTEROSCOPY WITH VERSAPOINT RESECTION;  Surgeon: Eldred Manges, MD;  Location: [facility];  Service: Gynecology;  Laterality: N/A;  . HYSTEROSCOPY    . TUMOR REMOVAL     breast - right  . VAGINAL HYSTERECTOMY Bilateral 12/03/2014   Procedure: TOTAL VAGINAL HYSTERECTOMY Bilateral Salpingectomy. aspiration of right ovarian cyst;  Surgeon: Eldred Manges, MD;  Location: [facility];  Service: Gynecology;  Laterality: Bilateral;  . VULVA /PERINEUM BIOPSY Left 12/20/2012   Procedure: VULVAR BIOPSY;  Surgeon: Eldred Manges, MD;  Location: [facility];  Service: Gynecology;  Laterality: Left;  . WISDOM TOOTH EXTRACTION      There were no vitals filed for this visit.       Subjective Assessment - 07/20/16 0900    Subjective Feeling good.  No pain today.     Currently in Pain? No/denies                         [facility] Adult PT Treatment/Exercise - 07/20/16 0001      Neck Exercises: Machines for Strengthening   UBE (Upper Arm Bike) Level 1x 6 minutes (3/3)     Neck Exercises: Standing   Other Standing Exercises snow angels on wall: 2x10     Shoulder Exercises: Supine   Horizontal ABduction Strengthening;Both;20 reps;Theraband  on foam roll   Theraband Level (Shoulder Horizontal ABduction) Level 3 (Green)   External Rotation Strengthening;Both;20 reps  on foam roll   Theraband Level (Shoulder External Rotation) Level 3 (Green)   Flexion Strengthening;Both;20 reps;Theraband   Theraband Level (Shoulder Flexion) Level 3 (Green)  D2 on foam roll     Shoulder Exercises: Seated   Flexion Strengthening;Both;20 reps;Weights   Flexion Weight (lbs) 1   Abduction Strengthening;Both;20 reps;Weights   ABduction Weight (lbs) 1   ABduction Limitations scaption 2x10 with 1#     Traction   Type of Traction Cervical   Min (lbs) 5   Max (lbs) 16   Hold Time 60   Rest Time 20   Time 15  PT Short Term Goals - 07/11/16 0855      PT SHORT TERM GOAL #3   Title pt will be independent with initial HEP   Status Achieved           PT Long Term Goals - 07/18/16 6861      PT LONG TERM GOAL #1   Title pt will be independent with advanced HEP   Time 8   Period Weeks   Status On-going     PT LONG TERM GOAL #2   Title FOTO < or = to 28% limited   Baseline 30%   Time 8   Period Weeks   Status On-going     PT LONG TERM GOAL #4   Title Pt will report 75% reduced pain during the day   Baseline 60% better   Time 8   Period Weeks   Status On-going     PT LONG TERM GOAL #5   Title pt will be able to sleep through the night without waking due to pain   Baseline 90% better   Period Weeks   Status On-going                Plan - 07/20/16 0900    Clinical Impression Statement Pt with no pain today.  Overall, pt reports that symptoms have centralized with minimal to no UE radiculopathy over the past 2 weeks.  Pt continues to make postural corrections and is working on postural strength.  Pt will continue to benefit from skilled PT for traction and strength.     Rehab Potential Excellent   PT Frequency 2x / week   PT Duration 8 weeks   PT Treatment/Interventions ADLs/Self Care Home Management;Biofeedback;Cryotherapy;Electrical Stimulation;Iontophoresis '4mg'$ /ml Dexamethasone;Moist Heat;Traction;Ultrasound;Therapeutic activities;Therapeutic exercise;Neuromuscular re-education;Patient/family education;Manual techniques;Passive range of motion;Dry needling;Taping   PT Next Visit Plan traction, manual, UE and postural strengthening   Consulted and Agree with Plan of Care Patient      Patient will benefit from skilled therapeutic intervention in order to improve the following deficits and impairments:  Pain, Increased muscle spasms, Impaired UE functional use, Decreased strength, Postural dysfunction  Visit Diagnosis: Cervicalgia  Muscle weakness (generalized)  Acute pain of left shoulder     Problem List Patient Active Problem List   Diagnosis Date Noted  . Alopecia 06/15/2016  . Hirsutism 06/15/2016  . Pain in pelvis 06/15/2016  . Prediabetes 09/14/2015  . Recurrent candidiasis of vagina 01/22/2015  . Anemia due to chronic blood loss 12/04/2014  . History of hysterectomy 12/03/2014     Sigurd Sos, PT 07/20/16 9:24 AM PHYSICAL THERAPY DISCHARGE SUMMARY  Visits from Start of Care: 9  Current functional level related to goals / functional outcomes: Pt canceled all remaining appointments due to work conflict and didn't return.  See above for current status.   Remaining deficits: See above for most current status.   Education / Equipment: HEP Plan: Patient agrees to  discharge.  Patient goals were partially met. Patient is being discharged due to not returning since the last visit.  ?????        Sigurd Sos, PT 09/14/16 11:56 AM    [facility] [address], [place], [place], [address] Phone: [phone]   Fax:  [phone]  Name: Julie [name] MRN: [id number] Date of Birth: 06/01/1975

## 2016-07-21 ENCOUNTER — Other Ambulatory Visit: Payer: Self-pay | Admitting: Physician Assistant

## 2016-07-21 DIAGNOSIS — M5412 Radiculopathy, cervical region: Secondary | ICD-10-CM

## 2016-08-04 ENCOUNTER — Ambulatory Visit
Admission: RE | Admit: 2016-08-04 | Discharge: 2016-08-04 | Disposition: A | Discharge status: Home / Self Care | Payer: BC Managed Care – PPO | Source: Ambulatory Visit | Attending: Physician Assistant | Admitting: Physician Assistant

## 2016-08-04 DIAGNOSIS — M5412 Radiculopathy, cervical region: Secondary | ICD-10-CM

## 2016-08-04 MED ORDER — IOPAMIDOL (ISOVUE-M 300) INJECTION 61%
1.0000 mL | Freq: Once | INTRAMUSCULAR | Status: AC | PRN
  Administered 2016-08-04: 1 mL via EPIDURAL

## 2016-08-04 MED ORDER — TRIAMCINOLONE ACETONIDE 40 MG/ML IJ SUSP (RADIOLOGY)
120.0000 mg | Freq: Once | INTRAMUSCULAR | Status: AC
  Administered 2016-08-04: 60 mg via EPIDURAL

## 2016-11-13 ENCOUNTER — Other Ambulatory Visit: Payer: Self-pay | Admitting: Physician Assistant

## 2016-11-13 DIAGNOSIS — M5412 Radiculopathy, cervical region: Secondary | ICD-10-CM

## 2017-02-13 ENCOUNTER — Ambulatory Visit
Admission: RE | Admit: 2017-02-13 | Discharge: 2017-02-13 | Disposition: A | Discharge status: Home / Self Care | Payer: BC Managed Care – PPO | Source: Ambulatory Visit | Attending: Physician Assistant | Admitting: Physician Assistant

## 2017-02-13 DIAGNOSIS — M5412 Radiculopathy, cervical region: Secondary | ICD-10-CM

## 2017-02-13 MED ORDER — TRIAMCINOLONE ACETONIDE 40 MG/ML IJ SUSP (RADIOLOGY)
60.0000 mg | Freq: Once | INTRAMUSCULAR | Status: AC
  Administered 2017-02-13: 60 mg via EPIDURAL

## 2017-02-13 MED ORDER — IOPAMIDOL (ISOVUE-M 300) INJECTION 61%
1.0000 mL | Freq: Once | INTRAMUSCULAR | Status: AC | PRN
  Administered 2017-02-13: 1 mL via EPIDURAL

## 2017-02-13 NOTE — Discharge Instructions (Signed)

## 2017-02-14 ENCOUNTER — Encounter (HOSPITAL_BASED_OUTPATIENT_CLINIC_OR_DEPARTMENT_OTHER): Payer: Self-pay

## 2017-02-14 ENCOUNTER — Emergency Department (HOSPITAL_BASED_OUTPATIENT_CLINIC_OR_DEPARTMENT_OTHER): Payer: BC Managed Care – PPO

## 2017-02-14 ENCOUNTER — Emergency Department (HOSPITAL_BASED_OUTPATIENT_CLINIC_OR_DEPARTMENT_OTHER)
Admission: EM | Admit: 2017-02-14 | Discharge: 2017-02-14 | Disposition: A | Discharge status: Home / Self Care | Payer: BC Managed Care – PPO | Attending: Emergency Medicine | Admitting: Emergency Medicine

## 2017-02-14 ENCOUNTER — Other Ambulatory Visit: Payer: Self-pay

## 2017-02-14 DIAGNOSIS — R51 Headache: Secondary | ICD-10-CM | POA: Diagnosis present

## 2017-02-14 DIAGNOSIS — R519 Headache, unspecified: Secondary | ICD-10-CM

## 2017-02-14 DIAGNOSIS — I10 Essential (primary) hypertension: Secondary | ICD-10-CM | POA: Insufficient documentation

## 2017-02-14 DIAGNOSIS — Z79899 Other long term (current) drug therapy: Secondary | ICD-10-CM | POA: Insufficient documentation

## 2017-02-14 DIAGNOSIS — M542 Cervicalgia: Secondary | ICD-10-CM | POA: Insufficient documentation

## 2017-02-14 LAB — CBC
HEMATOCRIT: 35.1 % — AB (ref 36.0–46.0)
Hemoglobin: 11.9 g/dL — ABNORMAL LOW (ref 12.0–15.0)
MCH: 32.2 pg (ref 26.0–34.0)
MCHC: 33.9 g/dL (ref 30.0–36.0)
MCV: 94.9 fL (ref 78.0–100.0)
Platelets: 254 10*3/uL (ref 150–400)
RBC: 3.7 MIL/uL — AB (ref 3.87–5.11)
RDW: 13 % (ref 11.5–15.5)
WBC: 7.5 10*3/uL (ref 4.0–10.5)

## 2017-02-14 LAB — BASIC METABOLIC PANEL
Anion gap: 8 (ref 5–15)
BUN: 18 mg/dL (ref 6–20)
CO2: 24 mmol/L (ref 22–32)
Calcium: 9.3 mg/dL (ref 8.9–10.3)
Chloride: 106 mmol/L (ref 101–111)
Creatinine, Ser: 0.92 mg/dL (ref 0.44–1.00)
GFR calc Af Amer: >60 mL/min (ref >60)
GFR calc non Af Amer: >60 mL/min (ref >60)
GLUCOSE: 135 mg/dL — AB (ref 65–99)
POTASSIUM: 3.7 mmol/L (ref 3.5–5.1)
Sodium: 138 mmol/L (ref 135–145)

## 2017-02-14 MED ORDER — DIPHENHYDRAMINE HCL 50 MG/ML IJ SOLN
25.0000 mg | Freq: Once | INTRAMUSCULAR | Status: AC
  Administered 2017-02-14: 25 mg via INTRAVENOUS
  Filled 2017-02-14: qty 1

## 2017-02-14 MED ORDER — SODIUM CHLORIDE 0.9 % IV SOLN: INTRAVENOUS | Status: DC

## 2017-02-14 MED ORDER — DEXAMETHASONE SODIUM PHOSPHATE 10 MG/ML IJ SOLN
10.0000 mg | Freq: Once | INTRAMUSCULAR | Status: AC
  Administered 2017-02-14: 10 mg via INTRAVENOUS
  Filled 2017-02-14: qty 1

## 2017-02-14 MED ORDER — METOCLOPRAMIDE HCL 5 MG/ML IJ SOLN
10.0000 mg | Freq: Once | INTRAMUSCULAR | Status: AC
  Administered 2017-02-14: 10 mg via INTRAVENOUS
  Filled 2017-02-14: qty 2

## 2017-02-14 MED ORDER — SODIUM CHLORIDE 0.9 % IV BOLUS (SEPSIS)
1000.0000 mL | Freq: Once | INTRAVENOUS | Status: AC
  Administered 2017-02-14: 1000 mL via INTRAVENOUS

## 2017-02-14 NOTE — ED Notes (Signed)
Patient transported to CT 

## 2017-02-14 NOTE — ED Notes (Signed)
ED Provider at bedside. 

## 2017-02-14 NOTE — ED Notes (Signed)
Pt verbalizes understanding of dc instructions and denies any further needs at this time.  She states her daughter is driving her home.

## 2017-02-14 NOTE — ED Provider Notes (Signed)
[facility] EMERGENCY DEPARTMENT Provider Note   CSN: [id number] Arrival date & time: 02/14/17  1842     History   Chief Complaint Chief Complaint  Patient presents with  . Headache    HPI Julie [name] is a 42 y.o. female.  Patient with a history of hypertension.  Is been taking her medication.  Which is lisinopril.  Patient with a complaint of a right-sided parietal headache since Thursday.  Patient does have a history of migraines but this is not like her migraines.  They are normally associated with photophobia nausea and vomiting does not have any of that with this.  Patient without any chest pain shortness of breath no fevers.      Past Medical History:  Diagnosis Date  . Alopecia    h/o  . Anemia during pregnancy    history only  . Chlamydia   . Fibroadenoma of breast   . Heart murmur    as child - resolved as adult - no problem  . Hirsutism   . Menorrhagia   . Migraine   . SVD (spontaneous vaginal delivery)    x 3  . Trichomonas   . 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 Yeast infection     Patient Active Problem List   Diagnosis Date Noted  . Alopecia 06/15/2016  . Hirsutism 06/15/2016  . Pain in pelvis 06/15/2016  . Prediabetes 09/14/2015  . Recurrent candidiasis of vagina 01/22/2015  . Anemia due to chronic blood loss 12/04/2014  . History of hysterectomy 12/03/2014    Past Surgical History:  Procedure Laterality Date  . ABDOMINAL HYSTERECTOMY    . CESAREAN SECTION    . DILATION AND CURETTAGE OF UTERUS    . DILITATION & CURRETTAGE/HYSTROSCOPY WITH VERSAPOINT RESECTION N/A 12/20/2012   Procedure: DILATATION & CURETTAGE/HYSTEROSCOPY WITH VERSAPOINT RESECTION;  Surgeon: Eldred Manges, MD;  Location: [facility];  Service: Gynecology;  Laterality: N/A;  . HYSTEROSCOPY    . TUMOR REMOVAL     breast - right  . VAGINAL HYSTERECTOMY Bilateral 12/03/2014   Procedure: TOTAL VAGINAL HYSTERECTOMY Bilateral Salpingectomy. aspiration of  right ovarian cyst;  Surgeon: Eldred Manges, MD;  Location: [facility];  Service: Gynecology;  Laterality: Bilateral;  . VULVA /PERINEUM BIOPSY Left 12/20/2012   Procedure: VULVAR BIOPSY;  Surgeon: Eldred Manges, MD;  Location: [facility];  Service: Gynecology;  Laterality: Left;  . WISDOM TOOTH EXTRACTION      OB History    Gravida Para Term Preterm AB Living   4 4 1 1   5    SAB TAB Ectopic Multiple Live Births         1 5       Home Medications    Prior to Admission medications   Medication Sig Start Date End Date Taking? Authorizing Provider  LISINOPRIL PO Take by mouth.   Yes [provider]    Family History Family History  Problem Relation Age of Onset  . Hypertension Maternal Grandmother   . Diabetes Other   . Stroke Other   . Cancer Other   . Hypertension Maternal Aunt     Social History Social History   Tobacco Use  . Smoking status: Never Smoker  . Smokeless tobacco: Never Used  Substance Use Topics  . Alcohol use: No  . Drug use: No     Allergies   Patient has no known allergies.   Review of Systems Review of Systems  Constitutional:  Negative for fever.  HENT: Negative for congestion.   Eyes: Negative for photophobia and visual disturbance.  Respiratory: Negative for shortness of breath.   Cardiovascular: Negative for chest pain.  Gastrointestinal: Negative for abdominal pain.  Genitourinary: Negative for dysuria.  Musculoskeletal: Positive for neck pain. Negative for back pain and neck stiffness.  Skin: Negative for rash.  Neurological: Negative for syncope, speech difficulty, weakness, numbness and headaches.  Hematological: Does not bruise/bleed easily.  Psychiatric/Behavioral: Negative for confusion.     Physical Exam Updated Vital Signs BP (!) 153/80 (BP Location: Left Arm)   Pulse 64   Temp 98.9 F (37.2 C) (Oral)   Resp 16   Ht 1.626 m (5\' 4" )   Wt 79.8 kg (176 lb)   LMP 11/10/2014 (Exact Date)   SpO2 99%   BMI  30.21 kg/m   Physical Exam  Constitutional: She is oriented to person, place, and time. She appears well-developed and well-nourished. No distress.  HENT:  Head: Normocephalic and atraumatic.  Mouth/Throat: Oropharynx is clear and moist.  Eyes: Conjunctivae and EOM are normal. Pupils are equal, round, and reactive to light.  Neck: Normal range of motion. Neck supple.  Cardiovascular: Normal rate.  Pulmonary/Chest: Effort normal and breath sounds normal.  Abdominal: Soft. Bowel sounds are normal. There is no tenderness.  Musculoskeletal: Normal range of motion. She exhibits no edema.  Neurological: She is alert and oriented to person, place, and time. No cranial nerve deficit or sensory deficit. She exhibits normal muscle tone. Coordination normal.  Skin: Skin is warm.  Nursing note and vitals reviewed.    ED Treatments / Results  Labs (all labs ordered are listed, but only abnormal results are displayed) Labs Reviewed  CBC - Abnormal; Notable for the following components:      Result Value   RBC 3.70 (*)    Hemoglobin 11.9 (*)    HCT 35.1 (*)    All other components within normal limits  BASIC METABOLIC PANEL - Abnormal; Notable for the following components:   Glucose, Bld 135 (*)    All other components within normal limits    EKG  EKG Interpretation None       Radiology Ct Head Wo Contrast  Result Date: 02/14/2017 CLINICAL DATA:  Right-sided headache for several days EXAM: CT HEAD WITHOUT CONTRAST TECHNIQUE: Contiguous axial images were obtained from the base of the skull through the vertex without intravenous contrast. COMPARISON:  04/25/2012 FINDINGS: Brain: No evidence of acute infarction, hemorrhage, hydrocephalus, extra-axial collection or mass lesion/mass effect. Vascular: No hyperdense vessel or unexpected calcification. Skull: Normal. Negative for fracture or focal lesion. Sinuses/Orbits: No acute finding. Other: None. IMPRESSION: No acute intracranial  abnormality noted. Electronically Signed   By: Inez Catalina M.D.   On: 02/14/2017 20:53   Dg Inject Diag/thera/inc Needle/cath/plc Epi/cerv/thor W/img  Result Date: 02/13/2017 CLINICAL DATA:  Neck and LEFT arm pain. Excellent relief for several months. FLUOROSCOPY TIME:  50 seconds corresponding to a Dose Area Product of 28.56 Gy*m2 PROCEDURE: Informed written consent was obtained.  Time-out was performed. An appropriate skin entry site was chosen, cleansed with Betadine, and anesthetized with 1% lidocaine. CERVICAL EPIDURAL INJECTION An interlaminar approach was performed on the LEFT at C7-T1. A 20 gauge epidural needle was advanced using loss-of-resistance technique. DIAGNOSTIC EPIDURAL INJECTION Injection of Isovue-M 300 shows a good epidural pattern with spread above and below the level of needle placement, primarily on the LEFT. No vascular opacification is seen. THERAPEUTIC EPIDURAL INJECTION 1.5 ml of  Kenalog 40 mixed with 1 ml of 1% Lidocaine and 2 ml of normal saline were then instilled. The procedure was well-tolerated, and the patient was discharged thirty minutes following the injection in good condition. IMPRESSION: Technically successful first epidural injection on the LEFT at C7-T1. Electronically Signed   By: Staci Righter M.D.   On: 02/13/2017 12:35    Procedures Procedures (including critical care time)  Medications Ordered in ED Medications  sodium chloride 0.9 % bolus 1,000 mL (1,000 mLs Intravenous New Bag/Given 02/14/17 2100)  dexamethasone (DECADRON) injection 10 mg (10 mg Intravenous Given 02/14/17 2102)  diphenhydrAMINE (BENADRYL) injection 25 mg (25 mg Intravenous Given 02/14/17 2101)  metoCLOPramide (REGLAN) injection 10 mg (10 mg Intravenous Given 02/14/17 2103)     Initial Impression / Assessment and Plan / ED Course  I have reviewed the triage vital signs and the nursing notes.  Pertinent labs & imaging results that were available during my care of the patient were  reviewed by me and considered in my medical decision making (see chart for details).     Blood pressure showing improvement with improvement in the headache.  Blood pressure systolic 009.  Patient arrived with blood pressure systolic as high as 381.  Head CT negative for any acute findings.  Labs without significant abnormalities.  Although not typical migraine.  Patient showing signs of relief with migraine cocktail.  Did not give her Benadryl because she needed to drive herself home.  She has Benadryl at home she can take tonight.  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  Patient will follow up with her primary care doctor for additional blood pressure checks.  Although headache not typical for her migraines.  Could be a migraine variant for her.  Final Clinical Impressions(s) / ED Diagnoses   Final diagnoses:  Acute intractable headache, unspecified headache type  Essential hypertension    ED Discharge Orders    None       Fredia Sorrow, MD 02/14/17 2307

## 2017-02-14 NOTE — ED Triage Notes (Signed)
C/o HA x 6 days-NAD-steady gait

## 2017-02-14 NOTE — Discharge Instructions (Signed)
CT of head was negative for any acute findings.  Labs without significant abnormalities.  If you have Benadryl at home to take 25 mg or 50 mg of Benadryl tonight before you go to sleep.  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  Important follow-up with your regular doctor to make sure the blood pressure comes under control.  Return for any new or worse symptoms.

## 2017-02-14 NOTE — ED Notes (Signed)
EDP at bedside  

## 2017-02-14 NOTE — ED Notes (Signed)
Pt c/o intermittent headaches for the last several days, has not been relieved with a Goody powder.  Pt has not tried anything else for her pain, also is not compliant with her BP medication.

## 2017-03-19 ENCOUNTER — Other Ambulatory Visit: Payer: Self-pay | Admitting: Physician Assistant

## 2017-03-19 DIAGNOSIS — M5412 Radiculopathy, cervical region: Secondary | ICD-10-CM

## 2017-03-27 ENCOUNTER — Ambulatory Visit
Admission: RE | Admit: 2017-03-27 | Discharge: 2017-03-27 | Disposition: A | Discharge status: Home / Self Care | Payer: BC Managed Care – PPO | Source: Ambulatory Visit | Attending: Physician Assistant | Admitting: Physician Assistant

## 2017-03-27 DIAGNOSIS — M5412 Radiculopathy, cervical region: Secondary | ICD-10-CM

## 2017-03-27 MED ORDER — IOPAMIDOL (ISOVUE-M 300) INJECTION 61%
1.0000 mL | Freq: Once | INTRAMUSCULAR | Status: AC | PRN
  Administered 2017-03-27: 1 mL via EPIDURAL

## 2017-03-27 MED ORDER — TRIAMCINOLONE ACETONIDE 40 MG/ML IJ SUSP (RADIOLOGY)
60.0000 mg | Freq: Once | INTRAMUSCULAR | Status: AC
  Administered 2017-03-27: 60 mg via EPIDURAL

## 2017-03-27 NOTE — Discharge Instructions (Signed)

## 2017-06-03 IMAGING — XA DG INJECT/[PERSON_NAME] INC NEEDLE/CATH/PLC EPI/CERV/THOR W/IMG
2 series · 2 of 2 positions shown · non-contrast
Comparison: none

CLINICAL DATA: Left upper extremity pain. Cervical radiculopathy.
Displacement of the C6-7 cervical disc.

[Series 1: ortho standard · 1 of 1 slices shown (1 of 2)]
[im 1/1]
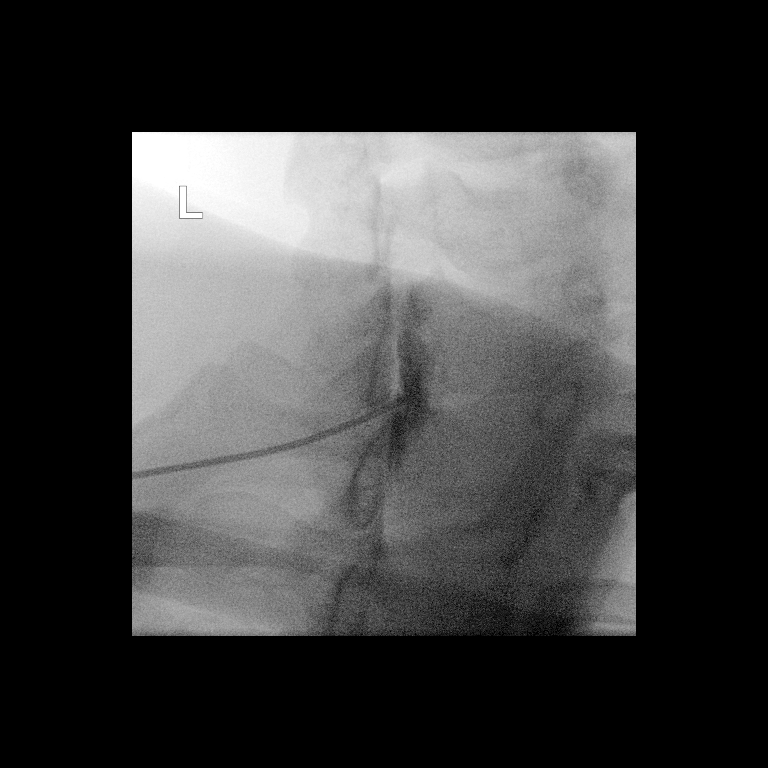

[Series 3: ortho standard · 1 of 1 slices shown (2 of 2)]
[im 1/1]
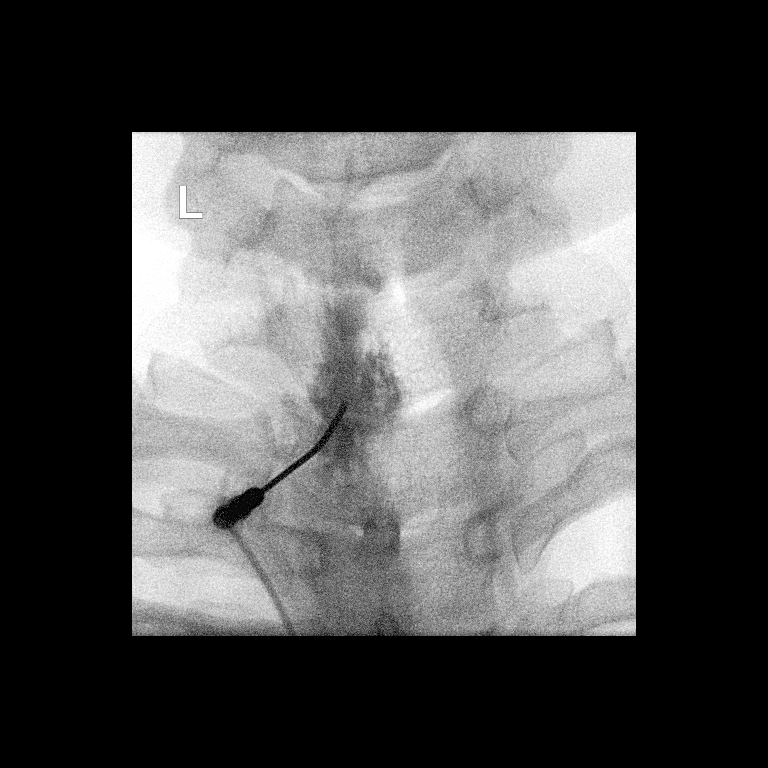

[2 of 2 positions shown; findings below may reference images not displayed]

FLUOROSCOPY TIME:  Radiation Exposure Index (as provided by the
fluoroscopic device): 15.53 uGy*m2

Fluoroscopy Time:  40 seconds

Number of Acquired Images:  0

PROCEDURE:
CERVICAL EPIDURAL INJECTION

An interlaminar approach was performed on the left at C7-T1 . A 20
gauge epidural needle was advanced using loss-of-resistance
technique.

DIAGNOSTIC EPIDURAL INJECTION

Injection of Isovue-M 300 shows a good epidural pattern with spread
above and below the level of needle placement, primarily on the
left. No vascular opacification is seen. THERAPEUTIC

EPIDURAL INJECTION

1.5 ml of Kenalog 40 mixed with 1 ml of 1% Lidocaine and 2 ml of
normal saline were then instilled. The procedure was well-tolerated,
and the patient was discharged thirty minutes following the
injection in good condition.
IMPRESSION: Technically successful first epidural injection on the left at
C7-T1.

## 2018-06-18 ENCOUNTER — Other Ambulatory Visit: Payer: Self-pay | Admitting: Family Medicine

## 2018-06-18 DIAGNOSIS — G8929 Other chronic pain: Secondary | ICD-10-CM

## 2018-07-04 ENCOUNTER — Other Ambulatory Visit: Payer: Self-pay

## 2018-07-04 ENCOUNTER — Ambulatory Visit
Admission: RE | Admit: 2018-07-04 | Discharge: 2018-07-04 | Disposition: A | Discharge status: Home / Self Care | Payer: BC Managed Care – PPO | Source: Ambulatory Visit | Attending: Family Medicine | Admitting: Family Medicine

## 2018-07-04 DIAGNOSIS — G8929 Other chronic pain: Secondary | ICD-10-CM

## 2018-07-04 MED ORDER — GADOBENATE DIMEGLUMINE 529 MG/ML IV SOLN
16.0000 mL | Freq: Once | INTRAVENOUS | Status: AC | PRN
  Administered 2018-07-04: 09:00:00 16 mL via INTRAVENOUS

## 2019-03-23 ENCOUNTER — Ambulatory Visit: Payer: BC Managed Care – PPO

## 2019-03-27 ENCOUNTER — Ambulatory Visit: Payer: BC Managed Care – PPO | Attending: Family

## 2019-03-27 DIAGNOSIS — Z23 Encounter for immunization: Secondary | ICD-10-CM

## 2019-03-27 NOTE — Progress Notes (Signed)
   Covid-19 Vaccination Clinic  Name:  Julie [name]    MRN: [id number] DOB: 1975-02-11  03/27/2019  [name] was observed post Covid-19 immunization for 15 minutes without incident. She was provided with Vaccine Information Sheet and instruction to access the V-Safe system.   [name] was instructed to call 911 with any severe reactions post vaccine: Marland Kitchen Difficulty breathing  . Swelling of face and throat  . A fast heartbeat  . A bad rash all over body  . Dizziness and weakness   Immunizations Administered    Name Date Dose VIS Date Route   Moderna COVID-19 Vaccine 03/27/2019  3:42 PM 0.5 mL 12/17/2018 Intramuscular   Manufacturer: Moderna   Lot: [id number]   [id number]

## 2019-04-29 ENCOUNTER — Ambulatory Visit: Payer: BC Managed Care – PPO | Attending: Family

## 2019-04-29 DIAGNOSIS — Z23 Encounter for immunization: Secondary | ICD-10-CM

## 2019-04-29 NOTE — Progress Notes (Signed)
   Covid-19 Vaccination [facility]  Name:  Julie [name]    MRN: [id number] DOB: December 07, 1975  04/29/2019  Julie [name] was observed post Covid-19 immunization for 15 minutes without incident. She was provided with Vaccine Information Sheet and instruction to access the V-Safe system.   Julie [name] was instructed to call 911 with any severe reactions post vaccine: Marland Kitchen Difficulty breathing  . Swelling of face and throat  . A fast heartbeat  . A bad rash all over body  . Dizziness and weakness   Immunizations Administered    Name Date Dose VIS Date Route   Moderna COVID-19 Vaccine 04/29/2019  1:04 PM 0.5 mL 12/17/2018 Intramuscular   Manufacturer: Moderna   Lot: [id number]   [id number]

## 2019-06-02 ENCOUNTER — Ambulatory Visit: Payer: BC Managed Care – PPO | Attending: Internal Medicine

## 2019-06-02 DIAGNOSIS — Z20822 Contact with and (suspected) exposure to covid-19: Secondary | ICD-10-CM

## 2019-06-03 LAB — NOVEL CORONAVIRUS, NAA: SARS-CoV-2, NAA: NOT DETECTED

## 2019-06-03 LAB — SARS-COV-2, NAA 2 DAY TAT

## 2019-07-08 ENCOUNTER — Ambulatory Visit: Payer: Self-pay | Admitting: Allergy

## 2019-07-24 ENCOUNTER — Encounter: Payer: Self-pay | Admitting: Allergy

## 2019-07-24 ENCOUNTER — Ambulatory Visit: Payer: BC Managed Care – PPO | Admitting: Allergy

## 2019-07-24 ENCOUNTER — Other Ambulatory Visit: Payer: Self-pay

## 2019-07-24 VITALS — BP 142/70 | HR 80 | Temp 97.3°F | Resp 18 | Ht 64.49 in | Wt 169.4 lb

## 2019-07-24 DIAGNOSIS — J310 Chronic rhinitis: Secondary | ICD-10-CM

## 2019-07-24 DIAGNOSIS — R05 Cough: Secondary | ICD-10-CM | POA: Diagnosis not present

## 2019-07-24 DIAGNOSIS — R059 Cough, unspecified: Secondary | ICD-10-CM

## 2019-07-24 DIAGNOSIS — R12 Heartburn: Secondary | ICD-10-CM | POA: Diagnosis not present

## 2019-07-24 MED ORDER — AZELASTINE-FLUTICASONE 137-50 MCG/ACT NA SUSP: 1.0000 | Freq: Two times a day (BID) | NASAL | 5 refills | Status: DC

## 2019-07-24 NOTE — Assessment & Plan Note (Signed)
   Continue with omeprazole 20mg  daily.  See below for lifestyle modification for heartburn.

## 2019-07-24 NOTE — Patient Instructions (Addendum)
Today's skin testing showed: Negative to indoor/outdoor allergens.   Coughing: . The most common causes of chronic cough include the following: upper airway cough syndrome (UACS) which is caused by variety of rhinitis conditions; asthma; gastroesophageal reflux disease (GERD); chronic bronchitis from cigarette smoking or other inhaled environmental irritants; non-asthmatic eosinophilic bronchitis; and bronchiectasis.  . In prospective studies, these conditions have accounted for up to 94% of the causes of chronic cough in immunocompetent adults.  . In your case, I recommend that you get a chest X-ray as well. Start dymista (fluticasone + azelastine nasal spray combination) 1 spray per nostril twice a day.  This replaces Flonase (fluticasone) for now. If it's not covered let us know.  . If above regimen does not help, then will recommend to stop lisinopril and switch to a different blood pressure medication as sometimes lisinopril can cause coughing as well.   Heartburn:  Continue with omeprazole 20mg  daily.  See below for lifestyle modification for heartburn.  Follow up in 2 months or sooner if needed.       Heartburn Heartburn is a type of pain or discomfort that can happen in the throat or chest. It is often described as a burning pain. It may also cause a bad, acid-like taste in the mouth. Heartburn may feel worse when you lie down or bend over. It may be worse at night. It may be caused by stomach contents that move back up (reflux) into the tube that connects the mouth with the stomach (esophagus). Follow these instructions at home: Eating and drinking   Avoid certain foods and drinks as told by your doctor. This may include: ? Coffee and tea (with or without caffeine). ? Drinks that have alcohol. ? Energy drinks and sports drinks. ? Carbonated drinks or sodas. ? Chocolate and cocoa. ? Peppermint and mint flavorings. ? Garlic and onions. ? Horseradish. ? Spicy and acidic  foods, such as:  Peppers.  Chili powder and curry powder.  Vinegar.  Hot sauces and BBQ sauce. ? Citrus fruit juices and citrus fruits, such as:  Oranges.  Lemons.  Limes. ? Tomato-based foods, such as:  Red sauce and pizza with red sauce.  Chili.  Salsa. ? Fried and fatty foods, such as:  Donuts.  [place] fries and potato chips.  High-fat dressings. ? High-fat meats, such as:  Hot dogs and sausage.  Rib eye steak.  Ham and bacon. ? High-fat dairy items, such as:  Whole milk.  Butter.  Cream cheese.  Eat small meals often. Avoid eating large meals.  Avoid drinking large amounts of liquid with your meals.  Avoid eating meals during the 2-3 hours before bedtime.  Avoid lying down right after you eat.  Do not exercise right after you eat. Lifestyle      If you are overweight, lose an amount of weight that is healthy for you. Ask your doctor about a safe weight loss goal.  Do not use any products that contain nicotine or tobacco, including cigarettes, e-cigarettes, and chewing tobacco. These can make your symptoms worse. If you need help quitting, ask your doctor.  Wear loose clothes. Do not wear anything tight around your waist.  Raise (elevate) the head of your bed about 6 inches (15 cm) when you sleep.  Try to lower your stress. If you need help doing this, ask your doctor. General instructions  Pay attention to any changes in your symptoms.  Take over-the-counter and prescription medicines only as told by your doctor. ?  Do not take aspirin, ibuprofen, or other NSAIDs unless your doctor says it is okay. ? Stop medicines only as told by your doctor.  Keep all follow-up visits as told by your doctor. This is important. Contact a doctor if:  You have new symptoms.  You lose weight and you do not know why it is happening.  You have trouble swallowing, or it hurts to swallow.  You have wheezing or a cough that keeps happening.  Your  symptoms do not get better with treatment.  You have heartburn often for more than 2 weeks. Get help right away if:  You have pain in your arms, neck, jaw, teeth, or back.  You feel sweaty, dizzy, or light-headed.  You have chest pain or shortness of breath.  You throw up (vomit) and your throw up looks like blood or coffee grounds.  Your poop (stool) is bloody or black. These symptoms may represent a serious problem that is an emergency. Do not wait to see if the symptoms will go away. Get medical help right away. Call your local emergency services (911 in the [place] Do not drive yourself to the hospital. Summary  Heartburn is a type of pain that can happen in the throat or chest. It can feel like a burning pain. It may also cause a bad, acid-like taste in the mouth.  You may need to avoid certain foods and drinks to help your symptoms. Ask your doctor what foods and drinks you should avoid.  Take over-the-counter and prescription medicines only as told by your doctor. Do not take aspirin, ibuprofen, or other NSAIDs unless your doctor told you to do so.  Contact your doctor if your symptoms do not get better or they get worse. This information is not intended to replace advice given to you by your health care provider. Make sure you discuss any questions you have with your health care provider. Document Revised: 06/04/2017 Document Reviewed: 06/04/2017 Elsevier Patient Education  [place].

## 2019-07-24 NOTE — Progress Notes (Signed)
New Patient Note  RE: Julie [name] MRN: [id number] DOB: 1975-04-13 Date of Office Visit: 07/24/2019  Referring provider: No ref. provider found Primary care provider: Janie Morning, DO  Chief Complaint: Allergic Rhinitis  (Coughing and sore throat)  History of Present Illness: I had the pleasure of seeing Julie [name] for initial evaluation at the [facility] on 07/24/2019. She is a 44 y.o. female, who is self-referred here for the evaluation of allergic rhinitis.  She reports symptoms of coughing and sore throat. Symptoms have been going on for 1 year but got worse in May after a possible URI but symptoms did not improve after amoxicillin treatment. It did improve after the second amoxicillin course and prednisone. Other triggers include exposure to outdoors. Coughing has no relations to eating or time of the day. Anosmia: no. Headache: sometimes. She has used Flonase with some improvement in symptoms. No benefit with Allegra-D and Claritin.  Negative to COVID-19 and up to date with vaccine. Patient has been on lisinopril 62m for about 3 years now with no increase in medication dose.   Sinus infections: 1. Previous work up includes: no. Previous ENT evaluation: in 2021 for snoring. Previous sinus imaging: no. History of nasal polyps: no. History of reflux: yes and takes omeprazole 280mdaily.  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 Not recent CXR.  No previous inhaler use. No pulmonology work up.  Denies wheezing or shortness of breath. Some chest tightness with the acid reflux.   Assessment and Plan: Julie [name] a [age].o. female with: Coughing Coughing with sore throat for 1 year but worse since May after URI. Improved after amoxicillin x 2 and prednisone. OTC antihistamines ineffective. Flonase helpful. ENT evaluation in the past for snoring. History of TB in 2001 which was  treated. Takes omeprazole for heartburn. No recent CXR.  Today's skin testing showed: Negative to indoor/outdoor allergens.   Today's spirometry was unremarkable with borderline improvement in FEV1 post bronchodilator treatment. Clinically unchanged. . The most common causes of chronic cough include the following: upper airway cough syndrome (UACS) which is caused by variety of rhinitis conditions; asthma; gastroesophageal reflux disease (GERD); chronic bronchitis from cigarette smoking or other inhaled environmental irritants; non-asthmatic eosinophilic bronchitis; and bronchiectasis.  . In prospective studies, these conditions have accounted for up to 94% of the causes of chronic cough in immunocompetent adults.  . Given patient's history, recommend getting a CXR. . Marland Kitchenifferential for the coughing include: post infectious cough, PND, heartburn and possibly lisinopril.  Start dymista (fluticasone + azelastine nasal spray combination) 1 spray per nostril twice a day to see if some of the coughing is from PND. This replaces Flonase (fluticasone) for now. If it's not covered let usKoreanow.  . If above regimen does not help, then will recommend to stop lisinopril and switch to a different blood pressure medication as sometimes lisinopril can cause coughing as well.   Heartburn  Continue with omeprazole 2084maily.  See below for lifestyle modification for heartburn.  Return in about 2 months (around 09/24/2019).  Meds ordered this encounter  Medications  . Azelastine-Fluticasone 137-50 MCG/ACT SUSP    Sig: Place 1 spray into the nose in the morning and at bedtime.    Dispense:  23 g    Refill:  5    Failed Flonase.   Other allergy screening: Food allergy: no Medication allergy: no Hymenoptera  allergy: no Urticaria: no Eczema: as a child History of recurrent infections suggestive of immunodeficency: no  Diagnostics: Spirometry:  Tracings reviewed. Her effort: It was hard to get consistent  efforts and there is a question as to whether this reflects a maximal maneuver. FVC: 2.57L FEV1: 2.35L, 93% predicted FEV1/FVC ratio: 91% Interpretation: No overt abnormalities noted given today's efforts with borderline improvement in FEV1 post bronchodilator treatment. Clinically feeling the same. Please see scanned spirometry results for details.  Skin Testing: Environmental allergy panel. Negative to indoor/outdoor allergens.  Results discussed with patient/family.  Airborne Adult Perc - 07/24/19 1423    Time Antigen Placed 0218    Allergen Manufacturer Lavella Hammock    Location Back    Number of Test 59    Panel 1 Select    1. Control-Buffer 50% Glycerol Negative    2. Control-Histamine 1 mg/ml 2+    3. Albumin saline Negative    4. [place] Negative    5. [place] Negative    6. Johnson Negative    7. [place] Blue Negative    8. Meadow Fescue Negative    9. Perennial Rye Negative    10. Sweet Vernal Negative    11. Timothy Negative    12. Cocklebur Negative    13. Burweed Marshelder Negative    14. Ragweed, short Negative    15. Ragweed, Giant Negative    16. Plantain,  English Negative    17. Lamb's Quarters Negative    18. Sheep Sorrell Negative    19. Rough Pigweed Negative    20. Marsh Elder, Rough Negative    21. Mugwort, Common Negative    22. Ash mix Negative    23. Birch mix Negative    24. Beech American Negative    25. Box, Elder Negative    26. Cedar, red Negative    27. Cottonwood, [place] Negative    28. Elm mix Negative    29. Hickory Negative    30. Maple mix Negative    31. Oak, [place] mix Negative    32. Pecan Pollen Negative    33. Pine mix Negative    34. Sycamore Eastern Negative    35. [place], Black Pollen Negative    36. Alternaria alternata Negative    37. Cladosporium Herbarum Negative    38. Aspergillus mix Negative    39. Penicillium mix Negative    40. Bipolaris sorokiniana (Helminthosporium) Negative    41. Drechslera spicifera  (Curvularia) Negative    42. Mucor plumbeus Negative    43. Fusarium moniliforme Negative    44. Aureobasidium pullulans (pullulara) Negative    45. Rhizopus oryzae Negative    46. Botrytis cinera Negative    47. Epicoccum nigrum Negative    48. Phoma betae Negative    49. Candida Albicans Negative    50. Trichophyton mentagrophytes Negative    51. Mite, D Farinae  5,000 AU/ml Negative    52. Mite, D Pteronyssinus  5,000 AU/ml Negative    53. Cat Hair 10,000 BAU/ml Negative    54.  Dog Epithelia Negative    55. Mixed Feathers Negative    56. Horse Epithelia Negative    57. Cockroach, German Negative    58. Mouse Negative    59. Tobacco Leaf Negative          Intradermal - 07/24/19 1507    Time Antigen Placed 0303    Allergen Manufacturer Lavella Hammock    Location Arm    Number of Test 15  Intradermal Select    Control Negative    Guatemala Negative    Johnson Negative    7 Grass Negative    Ragweed mix Negative    Weed mix Negative    Tree mix Negative    Mold 1 Negative    Mold 2 Negative    Mold 3 Negative    Mold 4 Negative    Cat Negative    Dog Negative    Cockroach Negative    Mite mix Negative           Past Medical History: Patient Active Problem List   Diagnosis Date Noted  . Coughing 07/24/2019  . Heartburn 07/24/2019  . Non-allergic rhinitis 07/24/2019  . Alopecia 06/15/2016  . Hirsutism 06/15/2016  . Pain in pelvis 06/15/2016  . Prediabetes 09/14/2015  . Recurrent candidiasis of vagina 01/22/2015  . Anemia due to chronic blood loss 12/04/2014  . History of hysterectomy 12/03/2014   Past Medical History:  Diagnosis Date  . Alopecia    h/o  . Anemia during pregnancy    history only  . Chlamydia   . Eczema   . Fibroadenoma of breast   . Heart murmur    as child - resolved as adult - no problem  . Hirsutism   . Menorrhagia   . Migraine   . SVD (spontaneous vaginal delivery)    x 3  . Trichomonas   . Tuberculosis 2001   meds x 6 months    . Yeast infection    Past Surgical History: Past Surgical History:  Procedure Laterality Date  . ABDOMINAL HYSTERECTOMY    . CESAREAN SECTION    . DILATION AND CURETTAGE OF UTERUS    . DILITATION & CURRETTAGE/HYSTROSCOPY WITH VERSAPOINT RESECTION N/A 12/20/2012   Procedure: DILATATION & CURETTAGE/HYSTEROSCOPY WITH VERSAPOINT RESECTION;  Surgeon: Eldred Manges, MD;  Location: [facility];  Service: Gynecology;  Laterality: N/A;  . HYSTEROSCOPY    . TUMOR REMOVAL     breast - right  . VAGINAL HYSTERECTOMY Bilateral 12/03/2014   Procedure: TOTAL VAGINAL HYSTERECTOMY Bilateral Salpingectomy. aspiration of right ovarian cyst;  Surgeon: Eldred Manges, MD;  Location: [facility];  Service: Gynecology;  Laterality: Bilateral;  . VULVA /PERINEUM BIOPSY Left 12/20/2012   Procedure: VULVAR BIOPSY;  Surgeon: Eldred Manges, MD;  Location: [facility];  Service: Gynecology;  Laterality: Left;  . WISDOM TOOTH EXTRACTION     Medication List:  Current Outpatient Medications  Medication Sig Dispense Refill  . Blood Glucose Monitoring Suppl ([facility] FLEX SYSTEM) w/Device KIT USE AS DIRECTED    . Blood Glucose Monitoring Suppl ([place]) w/Device KIT See admin instructions.    Marland Kitchen lisinopril (ZESTRIL) 40 MG tablet Take 40 mg by mouth daily.    Marland Kitchen omeprazole (PRILOSEC) 20 MG capsule Take 20 mg by mouth daily.    . rosuvastatin (CRESTOR) 20 MG tablet rosuvastatin 20 mg tablet    . Azelastine-Fluticasone 137-50 MCG/ACT SUSP Place 1 spray into the nose in the morning and at bedtime. 23 g 5   No current facility-administered medications for this visit.   Allergies: No Known Allergies Social History: Social History   Socioeconomic History  . Marital status: Divorced    Spouse name: Not on file  . Number of children: Not on file  . Years of education: Not on file  . Highest education level: Not on file  Occupational History  . Not on file  Tobacco Use  . Smoking status:  Never  Smoker  . Smokeless tobacco: Never Used  Substance and Sexual Activity  . Alcohol use: No  . Drug use: No  . Sexual activity: Not on file  Other Topics Concern  . Not on file  Social History Narrative  . Not on file   Social Determinants of Health   Financial Resource Strain:   . Difficulty of Paying Living Expenses:   Food Insecurity:   . Worried About Charity fundraiser in the Last Year:   . Arboriculturist in the Last Year:   Transportation Needs:   . Film/video editor (Medical):   Marland Kitchen Lack of Transportation (Non-Medical):   Physical Activity:   . Days of Exercise per Week:   . Minutes of Exercise per Session:   Stress:   . Feeling of Stress :   Social Connections:   . Frequency of Communication with Friends and Family:   . Frequency of Social Gatherings with Friends and Family:   . Attends Religious Services:   . Active Member of Clubs or Organizations:   . Attends Archivist Meetings:   Marland Kitchen Marital Status:    Lives in an apartment and moved 2 years ago. Smoking: denies Occupation: Marketing executive History: Environmental education officer in the house: no Carpet in the family room: yes Carpet in the bedroom: yes Heating: electric Cooling: central Pet: no  Family History: Family History  Problem Relation Age of Onset  . Hypertension Maternal Grandmother   . Diabetes Other   . Stroke Other   . Cancer Other   . Hypertension Maternal Aunt   . Allergic rhinitis Neg Hx   . Asthma Neg Hx   . Eczema Neg Hx   . Urticaria Neg Hx    Review of Systems  Constitutional: Negative for appetite change, chills, fever and unexpected weight change.  HENT: Negative for congestion and rhinorrhea.   Eyes: Negative for itching.  Respiratory: Positive for cough. Negative for chest tightness, shortness of breath and wheezing.   Cardiovascular: Negative for chest pain.  Gastrointestinal: Negative for abdominal pain.  Genitourinary: Negative for  difficulty urinating.  Skin: Negative for rash.  Allergic/Immunologic: Negative for environmental allergies.  Neurological: Negative for headaches.   Objective: BP (!) 142/70 (BP Location: Left Arm, Patient Position: Sitting, Cuff Size: Normal)   Pulse 80   Temp (!) 97.3 F (36.3 C) (Temporal)   Resp 18   Ht 5' 4.49" (1.638 m)   Wt 169 lb 6.4 oz (76.8 kg)   LMP 11/10/2014 (Exact Date)   SpO2 98%   BMI 28.64 kg/m  Body mass index is 28.64 kg/m. Physical Exam Vitals and nursing note reviewed.  Constitutional:      Appearance: Normal appearance. She is well-developed.  HENT:     Head: Normocephalic and atraumatic.     Right Ear: Tympanic membrane and external ear normal.     Left Ear: Tympanic membrane and external ear normal.     Nose: Nose normal. No congestion or rhinorrhea.     Mouth/Throat:     Mouth: Mucous membranes are moist.     Pharynx: Oropharynx is clear.  Eyes:     Conjunctiva/sclera: Conjunctivae normal.  Cardiovascular:     Rate and Rhythm: Normal rate and regular rhythm.     Heart sounds: Normal heart sounds. No murmur heard.  No friction rub. No gallop.   Pulmonary:     Effort: Pulmonary effort is normal.  Breath sounds: Normal breath sounds. No wheezing, rhonchi or rales.  Musculoskeletal:     Cervical back: Neck supple.  Skin:    General: Skin is warm.     Findings: No rash.  Neurological:     Mental Status: She is alert and oriented to person, place, and time.  Psychiatric:        Mood and Affect: Mood normal.        Behavior: Behavior normal.    The plan was reviewed with the patient/family, and all questions/concerned were addressed.  It was my pleasure to see Julie [name] today and participate in her care. Please feel free to contact me with any questions or concerns.  Sincerely,  Rexene Alberts, DO Allergy & Immunology  [facility] office: [phone] [facility] office: [place]  office: [phone]

## 2019-07-24 NOTE — Assessment & Plan Note (Addendum)
Coughing with sore throat for 1 year but worse since May after URI. Improved after amoxicillin x 2 and prednisone. OTC antihistamines ineffective. Flonase helpful. ENT evaluation in the past for snoring. History of TB in 2001 which was treated. Takes omeprazole for heartburn. No recent CXR.  Today's skin testing showed: Negative to indoor/outdoor allergens.   Today's spirometry was unremarkable with borderline improvement in FEV1 post bronchodilator treatment. Clinically unchanged. . The most common causes of chronic cough include the following: upper airway cough syndrome (UACS) which is caused by variety of rhinitis conditions; asthma; gastroesophageal reflux disease (GERD); chronic bronchitis from cigarette smoking or other inhaled environmental irritants; non-asthmatic eosinophilic bronchitis; and bronchiectasis.  . In prospective studies, these conditions have accounted for up to 94% of the causes of chronic cough in immunocompetent adults.  . Given patient's history, recommend getting a CXR. Marland Kitchen Differential for the coughing include: post infectious cough, PND, heartburn and possibly lisinopril.  Start dymista (fluticasone + azelastine nasal spray combination) 1 spray per nostril twice a day to see if some of the coughing is from PND. This replaces Flonase (fluticasone) for now. If it's not covered let us know.  . If above regimen does not help, then will recommend to stop lisinopril and switch to a different blood pressure medication as sometimes lisinopril can cause coughing as well.

## 2019-11-11 ENCOUNTER — Other Ambulatory Visit (HOSPITAL_BASED_OUTPATIENT_CLINIC_OR_DEPARTMENT_OTHER): Payer: Self-pay

## 2019-11-11 DIAGNOSIS — R0683 Snoring: Secondary | ICD-10-CM

## 2019-12-19 ENCOUNTER — Encounter (HOSPITAL_BASED_OUTPATIENT_CLINIC_OR_DEPARTMENT_OTHER): Payer: BC Managed Care – PPO | Admitting: Internal Medicine

## 2019-12-23 ENCOUNTER — Ambulatory Visit: Payer: BC Managed Care – PPO

## 2020-01-21 ENCOUNTER — Other Ambulatory Visit: Payer: Self-pay

## 2020-01-21 DIAGNOSIS — Z20822 Contact with and (suspected) exposure to covid-19: Secondary | ICD-10-CM

## 2020-01-22 LAB — SARS-COV-2, NAA 2 DAY TAT

## 2020-01-22 LAB — NOVEL CORONAVIRUS, NAA: SARS-CoV-2, NAA: NOT DETECTED

## 2021-04-17 ENCOUNTER — Emergency Department (HOSPITAL_BASED_OUTPATIENT_CLINIC_OR_DEPARTMENT_OTHER)
Admission: EM | Admit: 2021-04-17 | Discharge: 2021-04-17 | Disposition: A | Discharge status: Home / Self Care | Payer: BC Managed Care – PPO | Attending: Emergency Medicine | Admitting: Emergency Medicine

## 2021-04-17 ENCOUNTER — Emergency Department (HOSPITAL_BASED_OUTPATIENT_CLINIC_OR_DEPARTMENT_OTHER): Payer: BC Managed Care – PPO

## 2021-04-17 ENCOUNTER — Other Ambulatory Visit: Payer: Self-pay

## 2021-04-17 ENCOUNTER — Encounter (HOSPITAL_BASED_OUTPATIENT_CLINIC_OR_DEPARTMENT_OTHER): Payer: Self-pay

## 2021-04-17 DIAGNOSIS — R61 Generalized hyperhidrosis: Secondary | ICD-10-CM | POA: Diagnosis not present

## 2021-04-17 DIAGNOSIS — R112 Nausea with vomiting, unspecified: Secondary | ICD-10-CM | POA: Diagnosis present

## 2021-04-17 DIAGNOSIS — Z79899 Other long term (current) drug therapy: Secondary | ICD-10-CM | POA: Diagnosis not present

## 2021-04-17 DIAGNOSIS — R8289 Other abnormal findings on cytological and histological examination of urine: Secondary | ICD-10-CM | POA: Insufficient documentation

## 2021-04-17 DIAGNOSIS — E876 Hypokalemia: Secondary | ICD-10-CM | POA: Insufficient documentation

## 2021-04-17 DIAGNOSIS — K529 Noninfective gastroenteritis and colitis, unspecified: Secondary | ICD-10-CM | POA: Insufficient documentation

## 2021-04-17 LAB — URINALYSIS, ROUTINE W REFLEX MICROSCOPIC
Bilirubin Urine: NEGATIVE
Glucose, UA: NEGATIVE mg/dL
Hgb urine dipstick: NEGATIVE
Ketones, ur: NEGATIVE mg/dL
Leukocytes,Ua: NEGATIVE
Nitrite: NEGATIVE
Protein, ur: 30 mg/dL — AB
Specific Gravity, Urine: 1.015 (ref 1.005–1.030)
pH: 6 (ref 5.0–8.0)

## 2021-04-17 LAB — CBC
HCT: 39.4 % (ref 36.0–46.0)
Hemoglobin: 13.5 g/dL (ref 12.0–15.0)
MCH: 31.7 pg (ref 26.0–34.0)
MCHC: 34.3 g/dL (ref 30.0–36.0)
MCV: 92.5 fL (ref 80.0–100.0)
Platelets: 265 10*3/uL (ref 150–400)
RBC: 4.26 MIL/uL (ref 3.87–5.11)
RDW: 13.4 % (ref 11.5–15.5)
WBC: 9.8 10*3/uL (ref 4.0–10.5)
nRBC: 0 % (ref 0.0–0.2)

## 2021-04-17 LAB — COMPREHENSIVE METABOLIC PANEL
ALT: 18 U/L (ref 0–44)
AST: 18 U/L (ref 15–41)
Albumin: 5.2 g/dL — ABNORMAL HIGH (ref 3.5–5.0)
Alkaline Phosphatase: 41 U/L (ref 38–126)
Anion gap: 11 (ref 5–15)
BUN: 15 mg/dL (ref 6–20)
CO2: 26 mmol/L (ref 22–32)
Calcium: 10.5 mg/dL — ABNORMAL HIGH (ref 8.9–10.3)
Chloride: 100 mmol/L (ref 98–111)
Creatinine, Ser: 1.07 mg/dL — ABNORMAL HIGH (ref 0.44–1.00)
GFR, Estimated: >60 mL/min (ref >60)
Glucose, Bld: 111 mg/dL — ABNORMAL HIGH (ref 70–99)
Potassium: 3.2 mmol/L — ABNORMAL LOW (ref 3.5–5.1)
Sodium: 137 mmol/L (ref 135–145)
Total Bilirubin: 0.6 mg/dL (ref 0.3–1.2)
Total Protein: 8.7 g/dL — ABNORMAL HIGH (ref 6.5–8.1)

## 2021-04-17 LAB — OCCULT BLOOD X 1 CARD TO LAB, STOOL: Fecal Occult Bld: POSITIVE — AB

## 2021-04-17 LAB — LIPASE, BLOOD: Lipase: 19 U/L (ref 11–51)

## 2021-04-17 MED ORDER — AMOXICILLIN-POT CLAVULANATE 875-125 MG PO TABS: 1.0000 | ORAL_TABLET | Freq: Two times a day (BID) | ORAL | 0 refills | Status: DC

## 2021-04-17 MED ORDER — POTASSIUM CHLORIDE CRYS ER 20 MEQ PO TBCR
40.0000 meq | EXTENDED_RELEASE_TABLET | Freq: Once | ORAL | Status: AC
  Administered 2021-04-17: 40 meq via ORAL
  Filled 2021-04-17: qty 2

## 2021-04-17 MED ORDER — IOHEXOL 300 MG/ML  SOLN
100.0000 mL | Freq: Once | INTRAMUSCULAR | Status: AC | PRN
  Administered 2021-04-17: 80 mL via INTRAVENOUS

## 2021-04-17 MED ORDER — ONDANSETRON HCL 8 MG PO TABS: 4.0000 mg | ORAL_TABLET | Freq: Three times a day (TID) | ORAL | 0 refills | Status: DC | PRN

## 2021-04-17 MED ORDER — SODIUM CHLORIDE 0.9 % IV BOLUS
1000.0000 mL | Freq: Once | INTRAVENOUS | Status: AC
  Administered 2021-04-17: 1000 mL via INTRAVENOUS

## 2021-04-17 MED ORDER — AMOXICILLIN-POT CLAVULANATE 875-125 MG PO TABS
1.0000 | ORAL_TABLET | Freq: Once | ORAL | Status: AC
  Administered 2021-04-17: 1 via ORAL
  Filled 2021-04-17: qty 1

## 2021-04-17 NOTE — Discharge Instructions (Addendum)
Return to the ED with any new or worsening symptoms such as increased blood in stool, lightheadedness, dizziness or weakness or shortness of breath ?Please follow-up with Dr. Collene Mares.  I have attached your contact information to this guide. ?Please read the attached informational guides concerning bloody diarrhea and colitis for further information ?Please pick up antibiotics that I have sent in for you as well as a Zofran ?

## 2021-04-17 NOTE — ED Provider Notes (Signed)
?[facility] DEPT ?Provider Note ? ? ?CSN: [id number] ?Arrival date & time: 04/17/21  1621 ? ?  ? ?History ? ?Chief Complaint  ?Patient presents with  ? Rectal Bleeding  ? ? ?Julie [name] is a 46 y.o. female with medical history significant for migraines, anemia during pregnancy, alopecia.  Patient presents ED for evaluation of abdominal pain, nausea, vomiting.  Patient states that last night she began having bloody bowel movements.  Patient states that she has had 15 bloody stools since last night.  Patient states that this also happened a few months ago.  Patient denies any GI history, history of IBD.  Patient states that initially when she had her first episode of bloody stool she also had diaphoresis and lower abdominal pain.  Patient also endorsing nausea and vomiting x1.  Patient denies any anticoagulants.  Patient endorsing abdominal pain, bloody stool, diarrhea.  Patient states that she has taken Imodium with mild relief of symptoms.  Patient denies any fevers, body aches or chills, lightheadedness, dizziness, weakness, shortness of breath, dysuria. ? ? ? ? ?Rectal Bleeding ?Associated symptoms: abdominal pain and vomiting   ?Associated symptoms: no dizziness, no fever and no light-headedness   ? ?  ? ?Home Medications ?Prior to Admission medications   ?Medication Sig Start Date End Date Taking? Authorizing Provider  ?amoxicillin-clavulanate (AUGMENTIN) 875-125 MG tablet Take 1 tablet by mouth 2 (two) times daily. 04/17/21  Yes Azucena Cecil, PA-C  ?ondansetron (ZOFRAN) 8 MG tablet Take 0.5 tablets (4 mg total) by mouth every 8 (eight) hours as needed for nausea or vomiting. 04/17/21  Yes Azucena Cecil, PA-C  ?Azelastine-Fluticasone 137-50 MCG/ACT SUSP Place 1 spray into the nose in the morning and at bedtime. 07/24/19   Garnet Sierras, DO  ?Blood Glucose Monitoring Suppl ([place]) w/Device KIT USE AS DIRECTED 06/19/18   [provider]  ?Blood Glucose  Monitoring Suppl ([place]) w/Device KIT See admin instructions. 06/19/18   [provider]  ?lisinopril (ZESTRIL) 40 MG tablet Take 40 mg by mouth daily. 05/10/19   [provider]  ?omeprazole (PRILOSEC) 20 MG capsule Take 20 mg by mouth daily.    [provider]  ?rosuvastatin (CRESTOR) 20 MG tablet rosuvastatin 20 mg tablet    [provider]  ?   ? ?Allergies    ?Patient has no known allergies.   ? ?Review of Systems   ?Review of Systems  ?Constitutional:  Negative for chills and fever.  ?Respiratory:  Negative for shortness of breath.   ?Gastrointestinal:  Positive for abdominal pain, blood in stool, diarrhea, hematochezia, nausea and vomiting.  ?Genitourinary:  Negative for dysuria.  ?Neurological:  Negative for dizziness, weakness and light-headedness.  ?All other systems reviewed and are negative. ? ?Physical Exam ?Updated Vital Signs ?BP (!) 184/97   Pulse 81   Temp 98.3 ?F (36.8 ?C) (Oral)   Resp 16   Ht 5' 4.49" (1.638 m)   Wt 76.8 kg   LMP 11/10/2014 (Exact Date)   SpO2 100%   BMI 28.62 kg/m?  ?Physical Exam ?Vitals and nursing note reviewed.  ?Constitutional:   ?   General: She is not in acute distress. ?   Appearance: Normal appearance. She is not ill-appearing, toxic-appearing or diaphoretic.  ?HENT:  ?   Head: Normocephalic and atraumatic.  ?   Nose: Nose normal. No congestion.  ?   Mouth/Throat:  ?   Mouth: Mucous membranes are moist.  ?  Pharynx: Oropharynx is clear.  ?Eyes:  ?   Extraocular Movements: Extraocular movements intact.  ?   Conjunctiva/sclera: Conjunctivae normal.  ?   Pupils: Pupils are equal, round, and reactive to light.  ?Cardiovascular:  ?   Rate and Rhythm: Normal rate and regular rhythm.  ?Pulmonary:  ?   Effort: Pulmonary effort is normal. No respiratory distress.  ?   Breath sounds: Normal breath sounds. No wheezing.  ?Abdominal:  ?   General: Abdomen is flat. Bowel sounds are normal.  ?   Palpations: Abdomen is  soft.  ?   Tenderness: There is abdominal tenderness in the right lower quadrant, suprapubic area and left lower quadrant.  ?Genitourinary: ?   Rectum: Normal.  ?Musculoskeletal:  ?   Cervical back: Normal range of motion and neck supple.  ?Skin: ?   General: Skin is warm and dry.  ?   Capillary Refill: Capillary refill takes less than 2 seconds.  ?Neurological:  ?   Mental Status: She is alert and oriented to person, place, and time.  ? ? ?ED Results / Procedures / Treatments   ?Labs ?(all labs ordered are listed, but only abnormal results are displayed) ?Labs Reviewed  ?COMPREHENSIVE METABOLIC PANEL - Abnormal; Notable for the following components:  ?    Result Value  ? Potassium 3.2 (*)   ? Glucose, Bld 111 (*)   ? Creatinine, Ser 1.07 (*)   ? Calcium 10.5 (*)   ? Total Protein 8.7 (*)   ? Albumin 5.2 (*)   ? All other components within normal limits  ?URINALYSIS, ROUTINE W REFLEX MICROSCOPIC - Abnormal; Notable for the following components:  ? Protein, ur 30 (*)   ? Bacteria, UA RARE (*)   ? Non Squamous Epithelial 0-5 (*)   ? All other components within normal limits  ?OCCULT BLOOD X 1 CARD TO LAB, STOOL - Abnormal; Notable for the following components:  ? Fecal Occult Bld POSITIVE (*)   ? All other components within normal limits  ?LIPASE, BLOOD  ?CBC  ? ? ?EKG ?None ? ?Radiology ?CT ABDOMEN PELVIS W CONTRAST ? ?Result Date: 04/17/2021 ?CLINICAL DATA:  Left lower quadrant abdominal pain EXAM: CT ABDOMEN AND PELVIS WITH CONTRAST TECHNIQUE: Multidetector CT imaging of the abdomen and pelvis was performed using the standard protocol following bolus administration of intravenous contrast. RADIATION DOSE REDUCTION: This exam was performed according to the departmental dose-optimization program which includes automated exposure control, adjustment of the mA and/or kV according to patient size and/or use of iterative reconstruction technique. CONTRAST:  143m OMNIPAQUE IOHEXOL 300 MG/ML  SOLN COMPARISON:  None.  FINDINGS: Lower chest: Mild right basilar subsegmental atelectasis. Heart size is normal. Hepatobiliary: No focal liver abnormality is seen. No gallstones, gallbladder wall thickening, or biliary dilatation. Pancreas: Unremarkable. No pancreatic ductal dilatation or surrounding inflammatory changes. Spleen: Normal in size without focal abnormality. Adrenals/Urinary Tract: Unremarkable adrenal glands. Kidneys enhance symmetrically without focal lesion, stone, or hydronephrosis. Ureters are nondilated. Urinary bladder appears unremarkable for the degree of distension. Stomach/Bowel: Prominent circumferential colonic wall thickening involving the distal transverse colon and the descending colon with mild pericolonic fat stranding. No extraluminal gas or organized pericolonic fluid collection. Stomach within normal limits. No dilated loops of bowel to suggest obstruction. A normal appendix is present in the right lower quadrant (series 2, image 67). Vascular/Lymphatic: No significant vascular findings are present. No enlarged abdominal or pelvic lymph nodes. Reproductive: Prior hysterectomy. Two right adnexal cysts measuring up to 4.4 cm  and 3.8 cm. No follow-up imaging recommended. Left adnexal region is within normal limits. Other: No free fluid. No abdominopelvic fluid collection. No pneumoperitoneum. Small fat containing umbilical hernia. Musculoskeletal: No acute or significant osseous findings. IMPRESSION: Prominent circumferential colonic wall thickening involving the distal transverse colon and the descending colon with mild pericolonic fat stranding, suggestive of an infectious or inflammatory colitis. Electronically Signed   By: Davina Poke D.O.   On: 04/17/2021 19:53   ? ?Procedures ?Procedures  ? ?Medications Ordered in ED ?Medications  ?amoxicillin-clavulanate (AUGMENTIN) 875-125 MG per tablet 1 tablet (has no administration in time range)  ?sodium chloride 0.9 % bolus 1,000 mL (0 mLs Intravenous  Stopped 04/17/21 2013)  ?iohexol (OMNIPAQUE) 300 MG/ML solution 100 mL (100 mLs Intravenous Contrast Given 04/17/21 1929)  ?potassium chloride SA (KLOR-CON M) CR tablet 40 mEq (40 mEq Oral Given 04/17/21 2029)  ? ? ?ED Course/ Med

## 2021-04-17 NOTE — ED Triage Notes (Signed)
Patient here POV from Home. ? ?Patient endorses having Blood coming out of Rectum that began yesterday PM. Noted during BM initially but Patient is now having Blood noted every Hour or so. ? ?Endorses Lower ABD Pain and Fatigue. No Anticoagulants. Mild Nausea. 1 Episode of Emesis last PM. No Fevers. ? ?NAD Noted during Triage. A&Ox4. GCS 15. Ambulatory. ?

## 2021-04-18 ENCOUNTER — Encounter: Payer: Self-pay | Admitting: Nurse Practitioner

## 2021-04-28 ENCOUNTER — Ambulatory Visit: Payer: Self-pay | Admitting: Nurse Practitioner

## 2021-09-21 ENCOUNTER — Encounter: Payer: Self-pay | Admitting: Neurology

## 2021-09-21 ENCOUNTER — Ambulatory Visit (INDEPENDENT_AMBULATORY_CARE_PROVIDER_SITE_OTHER): Payer: BC Managed Care – PPO | Admitting: Neurology

## 2021-09-21 VITALS — BP 156/89 | HR 97 | Ht 64.0 in | Wt 183.0 lb

## 2021-09-21 DIAGNOSIS — E663 Overweight: Secondary | ICD-10-CM

## 2021-09-21 DIAGNOSIS — G4730 Sleep apnea, unspecified: Secondary | ICD-10-CM | POA: Diagnosis not present

## 2021-09-21 DIAGNOSIS — G4710 Hypersomnia, unspecified: Secondary | ICD-10-CM | POA: Diagnosis not present

## 2021-09-21 DIAGNOSIS — D500 Iron deficiency anemia secondary to blood loss (chronic): Secondary | ICD-10-CM

## 2021-09-21 DIAGNOSIS — G4701 Insomnia due to medical condition: Secondary | ICD-10-CM | POA: Diagnosis not present

## 2021-09-21 DIAGNOSIS — R0683 Snoring: Secondary | ICD-10-CM

## 2021-09-21 DIAGNOSIS — G478 Other sleep disorders: Secondary | ICD-10-CM

## 2021-09-21 NOTE — Progress Notes (Signed)
SLEEP MEDICINE CLINIC    Provider:  Melvyn Novas, MD  Primary Care Physician:  Irena Reichmann, DO [address] [place] [address]     Referring Provider:         Chief Complaint according to patient   Patient presents with:     New Patient (Initial Visit)     Heavily snores in sleep for many years . As soon as she is asleep, she snores- She has seen ENT who rec sleep study- . Her fiance' has concern for witnessed apnea / gasping.  Averages 7/8 hours  in bed, but often sleeps 5-6 hours with fragmentation , waking up several times not sure what causes  these arousals, feels EDS/  tired during the day. Has allergies that affect breathing.      HISTORY OF PRESENT ILLNESS:  Julie [name] is a 46 y.o. African American female patient seen on 09/21/2021 from PCP for a sleep consultation.  Chief concern according to patient :     Julie [name]  has a past medical history of Alopecia, Anemia during pregnancy, Chlamydia, Eczema, Fibroadenoma of breast, Heart murmur, Hirsutism, Menorrhagia, Migraine, SVD (spontaneous vaginal delivery), Trichomonas, Tuberculosis (2001), and snoring.      Sleep relevant medical history: Nocturia 2-4 times, Sleep walking in child hood-, no Tonsillectomy, had cervical spine DDD. Wisdom teeth removed.  brace and retainers.   Family medical /sleep history: no other family member  dx with OSA, mother with insomnia.   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  The patient currently works in daytime.  Pets are not  present. Tobacco use: none .   ETOH use : socially less than 3/ week ,  Caffeine intake in form of Coffee( 2/ week) Soda( 3/ in PM per week) Tea ( /) or energy drinks. Regular exercise in form of -none.      Sleep habits are as follows: The patient's dinner time is between 7  PM. The patient goes to bed at 10-11 PM and often  has fallen asleep seated in front of the TV continues to sleep for 5-8 hours, wakes for 3 bathroom breaks, the first time at 3 AM, 5 AM .   The preferred sleep position is laterally and ends up supine , with the support of 1 pillow.  Dreams are reportedly  frequent/vivid.  6.30  AM is the usual rise time. The patient wakes up spontaneously/ with the sun rise. She reports not feeling refreshed or restored in AM, with symptoms such as dry mouth, morning headaches, and residual fatigue.  Naps are taken frequently, lasting from 45 to 60 minutes and are more refreshing , usually after a meal.    Review of Systems: Out of a complete 14 system review, the patient complains of only the following symptoms, and all other reviewed systems are negative.:  Fatigue, sleepiness , snoring, fragmented sleep, Insomnia - undesired early morning arousals, often limiting the night's sleep.    How likely are you to doze in the following situations: 0 = not likely, 1 = slight chance, 2 = moderate chance, 3 = high chance   Sitting and Reading? Watching Television? Sitting inactive in a public place (theater or meeting)? As a passenger in a car for an hour without a break? Lying down in the afternoon when circumstances permit? Sitting and talking to someone? Sitting  quietly after lunch without alcohol? In a car, while stopped for a few minutes in traffic?   Total = 7/ 24 points - but that changes after meals and after work.   FSS endorsed at 35/ 63 points.   Social History   Socioeconomic History   Marital status: Divorced    Spouse name: Not on file   Number of children: Not on file   Years of education: Not on file   Highest education level: Not on file  Occupational History   Not on file  Tobacco Use   Smoking status: Never   Smokeless tobacco: Never  Substance and Sexual Activity   Alcohol use: No   Drug use: No   Sexual activity: Not on file  Other Topics Concern   Not on file  Social  History Narrative   Not on file   Social Determinants of Health   Financial Resource Strain: Not on file  Food Insecurity: Not on file  Transportation Needs: Not on file  Physical Activity: Not on file  Stress: Not on file  Social Connections: Not on file    Family History  Problem Relation Age of Onset   Hypertension Maternal Grandmother    Diabetes Other    Stroke Other    Cancer Other    Hypertension Maternal Aunt    Allergic rhinitis Neg Hx    Asthma Neg Hx    Eczema Neg Hx    Urticaria Neg Hx     Past Medical History:  Diagnosis Date   Alopecia    h/o   Anemia during pregnancy    history only   Chlamydia    Eczema    Fibroadenoma of breast    Heart murmur    as child - resolved as adult - no problem   Hirsutism    Menorrhagia    Migraine    SVD (spontaneous vaginal delivery)    x 3   Trichomonas    Tuberculosis 2001   meds x 6 months   Yeast infection     Past Surgical History:  Procedure Laterality Date   ABDOMINAL HYSTERECTOMY     CESAREAN SECTION     DILATION AND CURETTAGE OF UTERUS     DILITATION & CURRETTAGE/HYSTROSCOPY WITH VERSAPOINT RESECTION N/A 12/20/2012   Procedure: DILATATION & CURETTAGE/HYSTEROSCOPY WITH VERSAPOINT RESECTION;  Surgeon: Eldred Manges, MD;  Location: [facility];  Service: Gynecology;  Laterality: N/A;   HYSTEROSCOPY     TUMOR REMOVAL     breast - right   VAGINAL HYSTERECTOMY Bilateral 12/03/2014   Procedure: TOTAL VAGINAL HYSTERECTOMY Bilateral Salpingectomy. aspiration of right ovarian cyst;  Surgeon: Eldred Manges, MD;  Location: [facility];  Service: Gynecology;  Laterality: Bilateral;   VULVA /PERINEUM BIOPSY Left 12/20/2012   Procedure: VULVAR BIOPSY;  Surgeon: Eldred Manges, MD;  Location: [facility];  Service: Gynecology;  Laterality: Left;   WISDOM TOOTH EXTRACTION       Current Outpatient Medications on File Prior to Visit  Medication Sig Dispense Refill   Blood Glucose Monitoring Suppl ([place]) w/Device KIT USE AS DIRECTED     Blood Glucose Monitoring Suppl ([place]) w/Device KIT See admin instructions.     hydrOXYzine (ATARAX) 25 MG tablet Take 1 tablet by mouth qhs x 2 weeks     losartan (COZAAR) 50 MG tablet Take 50 mg by mouth daily.     omeprazole (PRILOSEC) 20 MG capsule Take 20 mg by mouth  daily.     rosuvastatin (CRESTOR) 20 MG tablet rosuvastatin 20 mg tablet     No current facility-administered medications on file prior to visit.   Physical exam:  156/ 89 mmHg. 19 83 lbs 5"4" BMI   There were no vitals filed for this visit. There is no height or weight on file to calculate BMI.   Wt Readings from Last 3 Encounters:  04/17/21 169 lb 5 oz (76.8 kg)  07/24/19 169 lb 6.4 oz (76.8 kg)  02/14/17 176 lb (79.8 kg)     Ht Readings from Last 3 Encounters:  04/17/21 5' 4.49" (1.638 m)  07/24/19 5' 4.49" (1.638 m)  02/14/17 $RemoveB'5\' 4"'[id number]$  (1.626 m)     BMI 31. 4  General: The patient is awake, alert and appears not in acute distress. The patient is well groomed. Head: Normocephalic, atraumatic. Neck is supple. Mallampati 3 plus ,  neck circumference:16 inches .  Nasal airflow  patent.  Dental status: was crowded.  Cardiovascular:  Regular rate and cardiac rhythm by pulse,  without distended neck veins. Respiratory: no auscultation.  Skin:  Without evidence of ankle edema, or rash. Trunk: The patient's posture is erect.   Neurologic exam : The patient is awake and alert, oriented to place and time.   Memory subjective described as intact.  Attention span & concentration ability appears normal.  Speech is fluent,  without  dysarthria, dysphonia or aphasia.  Mood and affect are appropriate.   Cranial nerves: no loss of smell or taste reported  Pupils are equal and briskly reactive to light. Funduscopic exam deferred. .  Extraocular movements in vertical and horizontal planes were intact and without nystagmus.  No Diplopia. Visual fields by  finger perimetry are intact. Hearing was intact to soft voice and finger rubbing.   Facial sensation intact to fine touch. Facial motor strength is symmetric and tongue and uvula move midline.  Neck ROM : rotation, tilt and flexion extension were normal for age and shoulder shrug was symmetrical.    Motor exam:  Symmetric bulk, tone and ROM.   Normal tone without cog wheeling, symmetric grip strength .   Sensory:  Fine touch  and vibration were normal.  Proprioception tested in the upper extremities was normal.   Coordination: Rapid alternating movements in the fingers/hands were of normal speed.  The Finger-to-nose maneuver was intact without evidence of ataxia, dysmetria or tremor.   Gait and station: Patient could rise unassisted from a seated position, walked without assistive device.  Stance is of normal width/ base and the patient turned with 3 steps.  Toe and heel walk were deferred.  Deep tendon reflexes: in the  upper and lower extremities are symmetric and intact.  Babinski response was deferred .       After spending a total time of  35  minutes face to face and additional time for physical and neurologic examination, review of laboratory studies,  personal review of imaging studies, reports and results of other testing and review of referral information / records as far as provided in visit, I have established the following assessments:  1) Julie [name] has had knowledge about her snoring for many years but it has increased in intensity severity and she reports that she will stop snoring right after falling asleep.  Usually history starts off in a lateral sleep position but when she wakes up she finds herself on her back.  Snoring and apnea have been described by her fianc.  2)Her sleep is not as restorative and refreshing and if she had an opportunity to nap after a meal she will usually for about an hour.  She does wake up with a dry mouth and sometimes with headaches in  the morning she has also been woken by headaches at night.  Morning headaches are described as generalized and dull and often will resolve by themselves, nocturnal headaches are described as cluster headaches with a sharp piercing quality.  3) insomnia is in her case not the ability to initiate sleep but to sustain sleep.  She has further "" witching hour around 2 to 3 AM usually a bathroom break as needed and then it becomes less easy to go back to sleep and sleep remains more fragmented for the coming hours. Sometimes she gets hot at night, but no palpitations, no diaphoresis., no angina.    My Plan is to proceed with:  1) attended sleep study with SPLIT if AHI of 10/h or higher is reached.  PLan B is a HST>  2) addressing risk factors of obesity and GERD, causing additional apnea risks.  3) trazodone can be used to help sustain sleep.   I would like to thank Janie Morning, DO and Janie Morning, [place],  [place] [address] for allowing me to meet with and to take care of this pleasant patient.    I plan to follow up either personally or through our NP within 3-4  months.   CC: I will share my notes with PCP .  Electronically signed by: Larey Seat, MD 09/21/2021 1:04 PM  [facility] Board certified by The [employer] of Sleep Medicine and Diplomate of the [employer] of Sleep Medicine. Board certified In Neurology through the [place], Fellow of the [employer] of Neurology. Medical Director of [facility].

## 2021-09-21 NOTE — Patient Instructions (Signed)
Safe Surgery and Sleep Apnea Sleep apnea is a condition in which breathing pauses or becomes shallow during sleep. Most people with the condition are not aware that they have it. Your health care providers need to know whether or not you have sleep apnea, especially if you are having surgery. Sleep apnea can increase your risk of complications during and after surgery. Tell a health care provider about: Any medical conditions you have, especially if you have sleep apnea. Any allergies you have. All medicines you are taking, including vitamins, herbs, eye drops, creams, and over-the-counter medicines. Any blood disorders you have. Any problems you or family members have had with anesthetic medicines. Any surgeries you have had. Whether you are pregnant or may be pregnant. What are the risks of having surgery? Untreated sleep apnea increases the risk for certain complications during and after surgery. This is because when you have sleep apnea, your airways are more sensitive to medicines used during surgery. The airways can collapse and block the flow of air.  Having untreated sleep apnea can increase your risk for: A longer stay in the recovery room or hospital. Breathing difficulties such as low oxygen levels after surgery. Increased pain after surgery. Irregular heart rhythms. Stroke. Heart attack. You and your health care provider can take steps to help prevent these and other complications. What happens before the surgery? Sleep apnea screening Sleep apnea screening is a series of questions that determine if you are at risk for sleep apnea. Before you have surgery, get screened for sleep apnea and talk with your surgeon and primary health care provider about your results. Screening usually involves answering questions about your sleep quality. Ask your health care provider if you can be screened, or take a screening test yourself. You can find these tests online at the American Sleep Apnea  Association website. Some questions you may be asked include: Do you snore? Is your sleep restless? Do you have daytime sleepiness? Has a partner or spouse told you that you stop breathing during sleep? Have you had trouble concentrating or memory loss? Answer these questions honestly. If a screening test is positive, this means you are at risk for the condition. Further testing may be needed to confirm a diagnosis of sleep apnea. General instructions Talk to your health care provider about your individual risks based on your screening results and the type of surgery you will be having. If you have a sleep apnea device (positive airway pressure device), wear it as prescribed. If you have not been wearing your device, talk with your health care provider about why you have not been wearing it. There are ways to improve your use of the device, such as: Adjusting the mask. Adding humidified air. Getting treatment for nasal congestion. Do not use any products that contain nicotine or tobacco. These products include cigarettes, chewing tobacco, and vaping devices, such as e-cigarettes. If you need help quitting, ask your health care provider. Do not drink alcohol the day before or after your surgery because it can worsen your sleep apnea. What happens on the day of surgery? If told by your health care provider, bring your sleep apnea device with you. Wear your sleep apnea device when you are sleeping during your hospital stay, or as told by your health care provider. Ask your health care provider what special considerations will be taken during and after your surgery. What can I expect after the surgery? You may need to be given extra oxygen and wear a continuous   oxygen monitor (pulse oximetry). For your safety, you may need to stay in the recovery room or hospital for longer than usual. Follow these instructions at home: Medicines Avoid using sleep medicines unless they are prescribed by a health  care provider who is aware of the results of your sleep apnea screening. Avoid using sleep medicines while taking opioid pain medicine. Limit your use of opioid pain medicines as much as possible. Ask your health care provider what is a safe amount to use. Ask about using pain medicines that do not affect your breathing, such as NSAIDs or acetaminophen. General instructions  If your health care provider approves, raise the head of your bed or lie on your side. Do not lie flat on your back. Follow instructions from your health care provider about wearing your sleep device: Anytime you are sleeping, including during daytime naps. While taking prescription pain medicines, sleeping medicines, or medicines that make you drowsy. If you will be going home right after the procedure, plan to have a responsible adult care for you for the time you are told. This is important. Where to find more information For more information about sleep apnea screening and healthy sleep, visit these websites: Centers for Disease Control and Prevention: http://www.wolf.info/ American Sleep Apnea Association: www.sleepapnea.org Contact a health care provider if: You have sleep apnea or think you may be at risk for sleep apnea, and you are scheduled for surgery. Get help right away if: You have trouble breathing. You are very drowsy and cannot stay awake. You are told that you have pauses in your breathing during sleep after surgery. You have chest pain. You have a fast heartbeat. These symptoms may represent a serious problem that is an emergency. Do not wait to see if the symptoms will go away. Get medical help right away. Call your local emergency services (911 in the [place] Do not drive yourself to the hospital. Summary Your health care providers need to know whether or not you have sleep apnea, especially if you are having surgery. If you have sleep apnea, you are at an increased risk for complications during surgery. You  and your health care provider can take precautions to help prevent complications. If you have sleep apnea, make sure to tell your health care provider and anesthesia specialist. This information is not intended to replace advice given to you by your health care provider. Make sure you discuss any questions you have with your health care provider. Document Revised: 12/12/2019 Document Reviewed: 12/12/2019 Elsevier Patient Education  [place] Sleep Information, Adult Quality sleep is important for your mental and physical health. It also improves your quality of life. Quality sleep means you: Are asleep for most of the time you are in bed. Fall asleep within 30 minutes. Wake up no more than once a night. Are awake for no longer than 20 minutes if you do wake up during the night. Most adults need 7-8 hours of quality sleep each night. How can poor sleep affect me? If you do not get enough quality sleep, you may have: Mood swings. Daytime sleepiness. Decreased alertness, reaction time, and concentration. Sleep disorders, such as insomnia and sleep apnea. Difficulty with: Solving problems. Coping with stress. Paying attention. These issues may affect your performance and productivity at work, school, and home. Lack of sleep may also put you at higher risk for accidents, suicide, and risky behaviors. If you do not get quality sleep, you may also be at higher risk for several  health problems, including: Infections. Type 2 diabetes. Heart disease. High blood pressure. Obesity. Worsening of long-term conditions, like arthritis, kidney disease, depression, Parkinson's disease, and epilepsy. What actions can I take to get more quality sleep? Sleep schedule and routine Stick to a sleep schedule. Go to sleep and wake up at about the same time each day. Do not try to sleep less on weekdays and make up for lost sleep on weekends. This does not work. Limit naps during the day to 30  minutes or less. Do not take naps in the late afternoon. Make time to relax before bed. Reading, listening to music, or taking a hot bath promotes quality sleep. Make your bedroom a place that promotes quality sleep. Keep your bedroom dark, quiet, and at a comfortable room temperature. Make sure your bed is comfortable. Avoid using electronic devices that give off bright blue light for 30 minutes before bedtime. Your brain perceives bright blue light as sunlight. This includes television, phones, and computers. If you are lying awake in bed for longer than 20 minutes, get up and do a relaxing activity until you feel sleepy. Lifestyle     Try to get at least 30 minutes of exercise on most days. Do not exercise 2-3 hours before going to bed. Do not use any products that contain nicotine or tobacco. These products include cigarettes, chewing tobacco, and vaping devices, such as e-cigarettes. If you need help quitting, ask your health care provider. Do not drink caffeinated beverages for at least 8 hours before going to bed. Coffee, tea, and some sodas contain caffeine. Do not drink alcohol or eat large meals close to bedtime. Try to get at least 30 minutes of sunlight every day. Morning sunlight is best. Medical concerns Work with your health care provider to treat medical conditions that may affect sleeping, such as: Nasal obstruction. Snoring. Sleep apnea and other sleep disorders. Talk to your health care provider if you think any of your prescription medicines may cause you to have difficulty falling or staying asleep. If you have sleep problems, talk with a sleep consultant. If you think you have a sleep disorder, talk with your health care provider about getting evaluated by a specialist. Where to find more information Sleep Foundation: sleepfoundation.org American Academy of Sleep Medicine: aasm.org Centers for Disease Control and Prevention (CDC): StoreMirror.com.cy Contact a health care provider  if: You have trouble getting to sleep or staying asleep. You often wake up very early in the morning and cannot get back to sleep. You have daytime sleepiness. You have daytime sleep attacks of suddenly falling asleep and sudden muscle weakness (narcolepsy). You have a tingling sensation in your legs with a strong urge to move your legs (restless legs syndrome). You stop breathing briefly during sleep (sleep apnea). You think you have a sleep disorder or are taking a medicine that is affecting your quality of sleep. Summary Most adults need 7-8 hours of quality sleep each night. Getting enough quality sleep is important for your mental and physical health. Make your bedroom a place that promotes quality sleep, and avoid things that may cause you to have poor sleep, such as alcohol, caffeine, smoking, or large meals. Talk to your health care provider if you have trouble falling asleep or staying asleep. This information is not intended to replace advice given to you by your health care provider. Make sure you discuss any questions you have with your health care provider. Document Revised: 04/27/2021 Document Reviewed: 04/27/2021 Elsevier Patient Education  2023 Elsevier Inc.  

## 2021-10-04 ENCOUNTER — Telehealth: Payer: Self-pay

## 2021-10-04 NOTE — Telephone Encounter (Signed)
LVM for pt to call back to schedule sleep study.  

## 2022-04-05 IMAGING — CT CT ABD-PELV W/ CM
2 of 5 series · 16 of 46 positions shown, 18 images · IV contrast (agent unspecified)
Comparison: None.

CLINICAL DATA: Left lower quadrant abdominal pain

EXAM:
CT ABDOMEN AND PELVIS WITH CONTRAST
TECHNIQUE: Multidetector CT imaging of the abdomen and pelvis was performed
using the standard protocol following bolus administration of
intravenous contrast.

[Series 2: abd pel w · axial · 0.57mm/px · z∈[+678,+1038]mm · 13 of 82 slices shown, 15 images]
[im 5/82  soft-tissue]
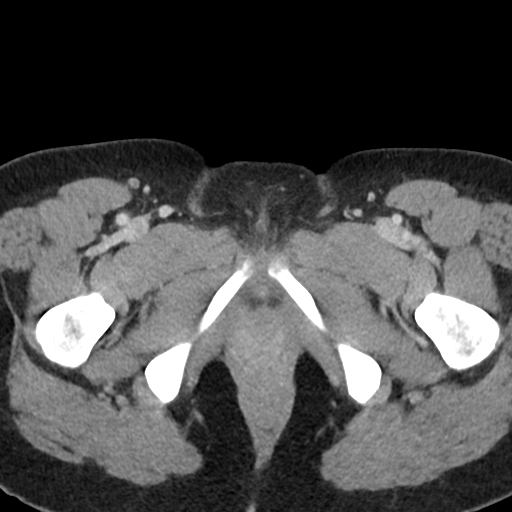
[im 5/82  bone]
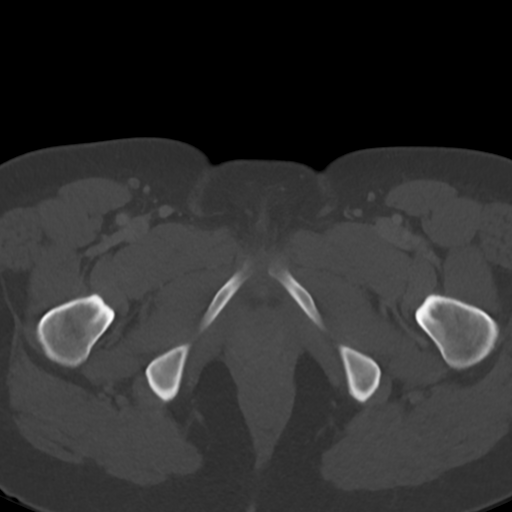
[im 13/82  soft-tissue]
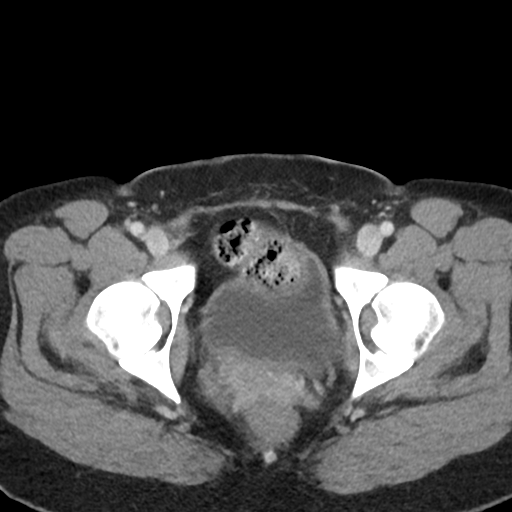
[im 18/82  soft-tissue]
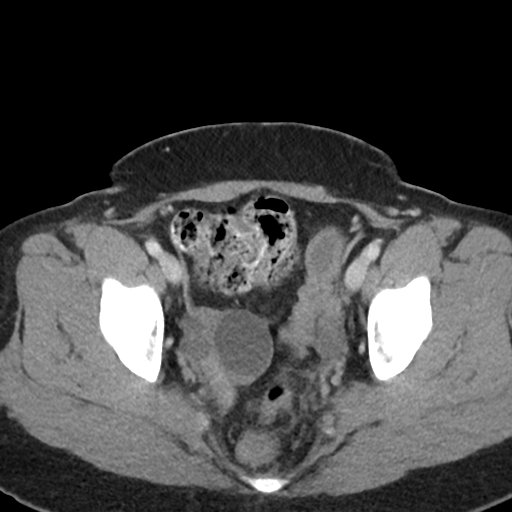
[im 22/82  soft-tissue]
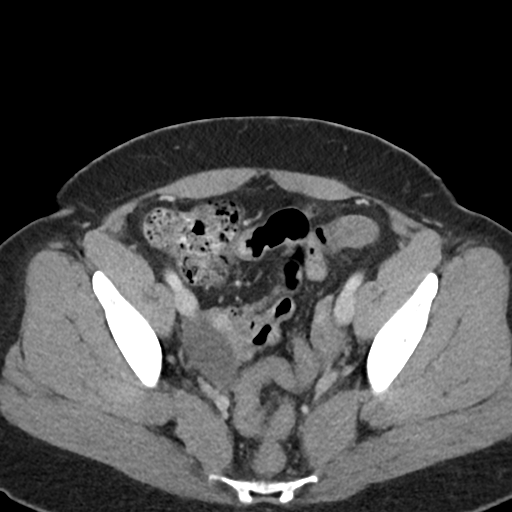
[im 30/82  soft-tissue]
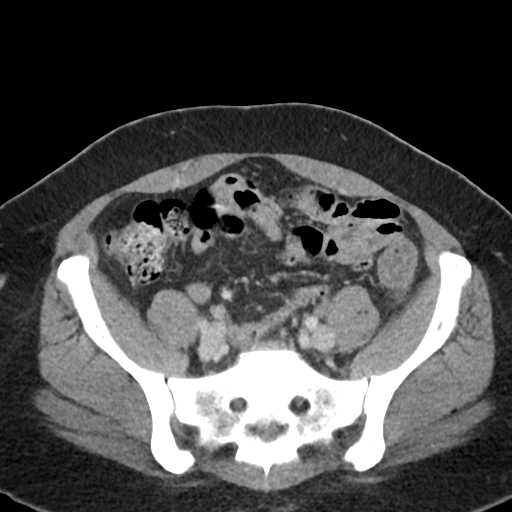
[im 35/82  soft-tissue]
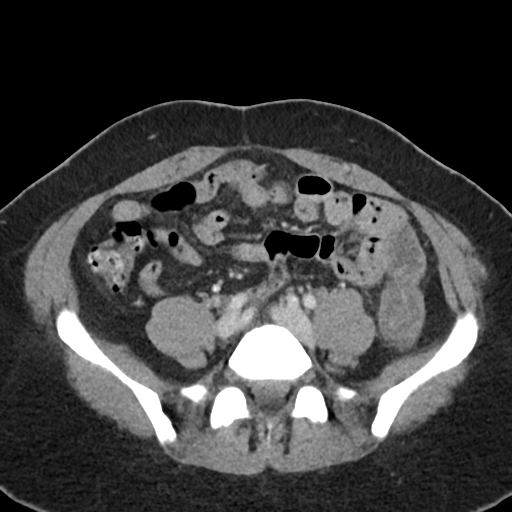
[im 43/82  soft-tissue]
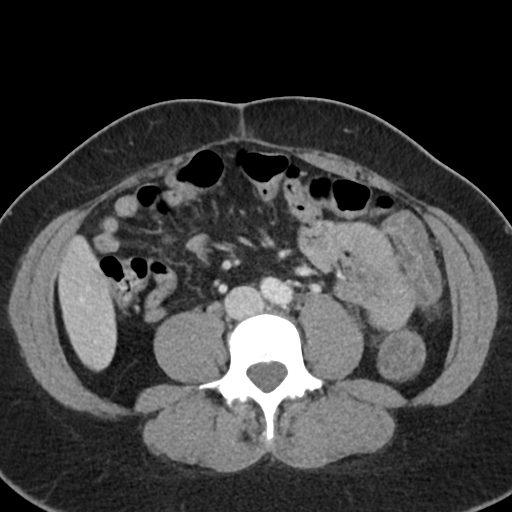
[im 47/82  soft-tissue]
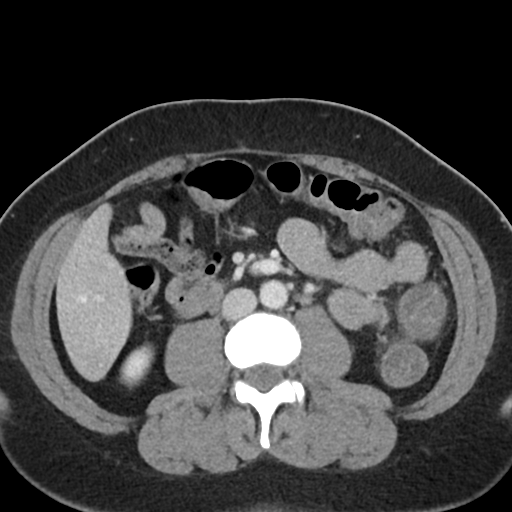
[im 52/82  soft-tissue]
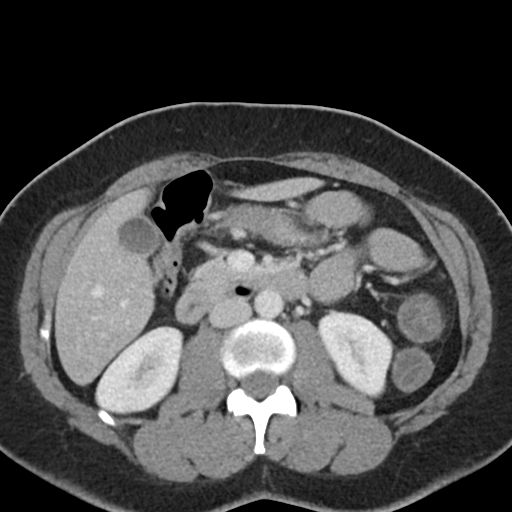
[im 52/82  bone]
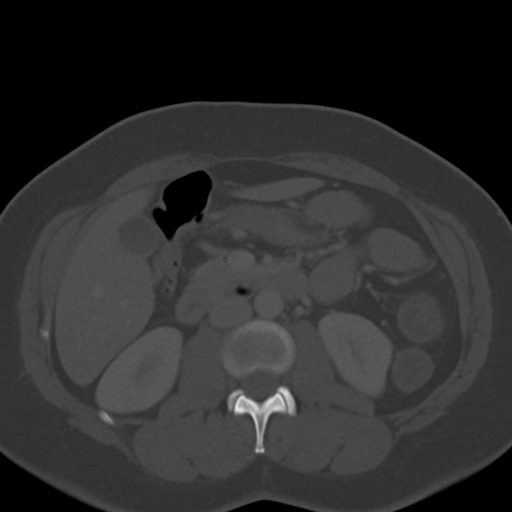
[im 60/82  soft-tissue]
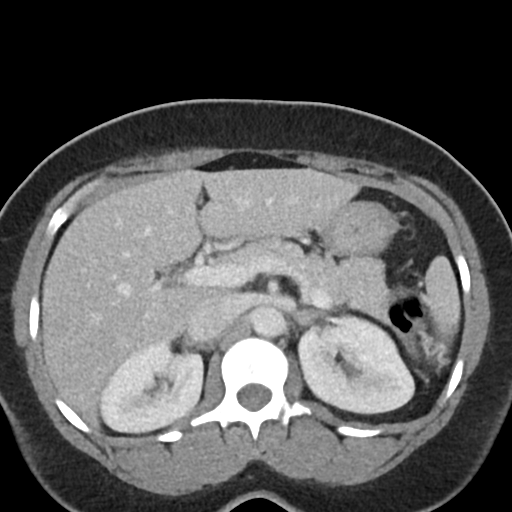
[im 64/82  soft-tissue]
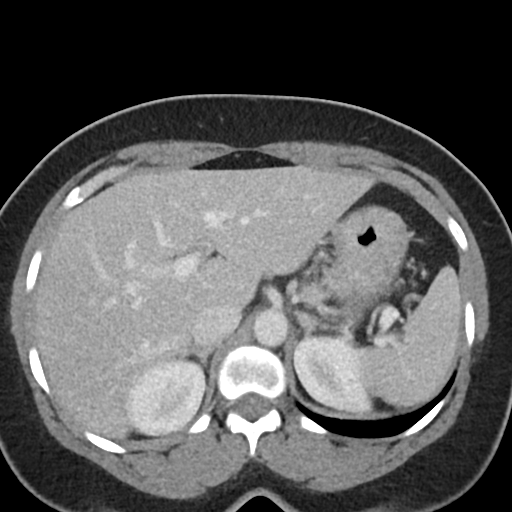
[im 69/82  soft-tissue]
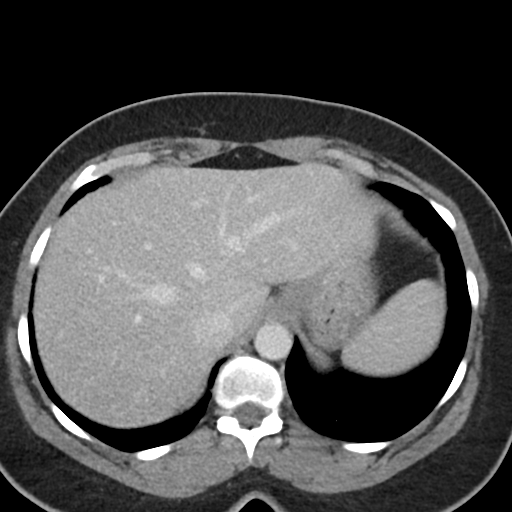
[im 77/82  soft-tissue]
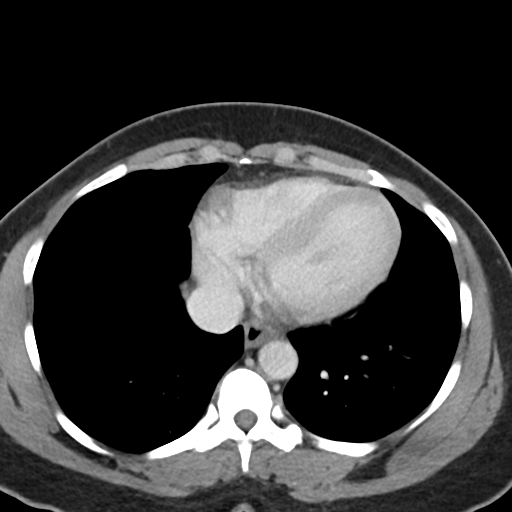

[Series 4: coronal · coronal · 0.80mm/px · 3 of 90 slices shown]
[im 30/90  soft-tissue]
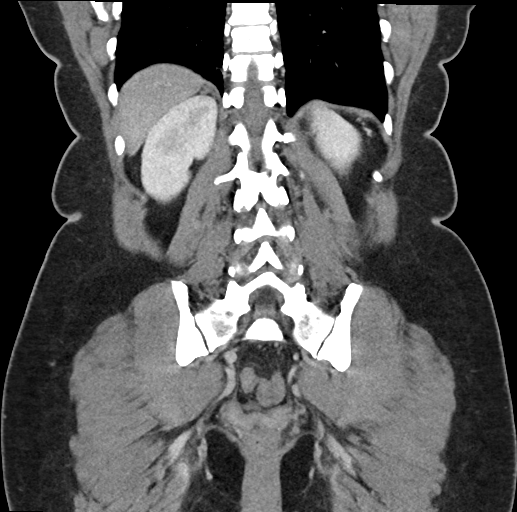
[im 40/90  soft-tissue]
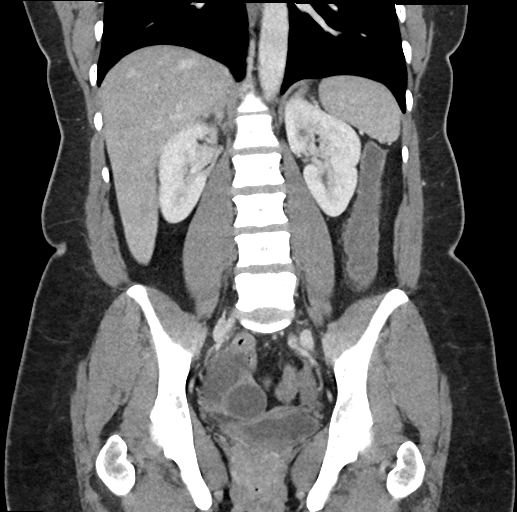
[im 50/90  soft-tissue]
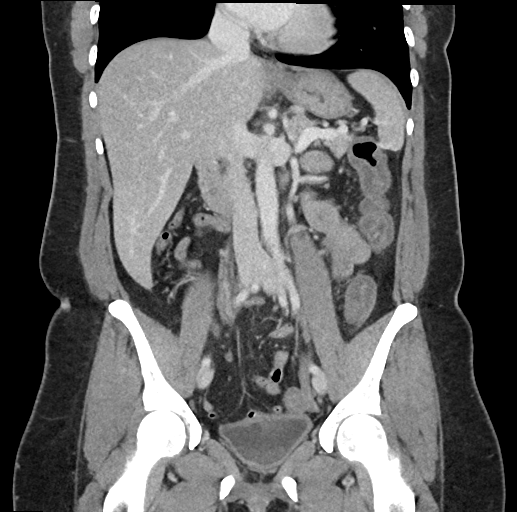

[16 of 46 positions shown; findings below may reference images not displayed]

RADIATION DOSE REDUCTION: This exam was performed according to the
departmental dose-optimization program which includes automated
exposure control, adjustment of the mA and/or kV according to
patient size and/or use of iterative reconstruction technique.

CONTRAST:  100mL OMNIPAQUE IOHEXOL 300 MG/ML  SOLN
FINDINGS: Lower chest: Mild right basilar subsegmental atelectasis. Heart size
is normal.

Hepatobiliary: No focal liver abnormality is seen. No gallstones,
gallbladder wall thickening, or biliary dilatation.

Pancreas: Unremarkable. No pancreatic ductal dilatation or
surrounding inflammatory changes.

Spleen: Normal in size without focal abnormality.

Adrenals/Urinary Tract: Unremarkable adrenal glands. Kidneys enhance
symmetrically without focal lesion, stone, or hydronephrosis.
Ureters are nondilated. Urinary bladder appears unremarkable for the
degree of distension.

Stomach/Bowel: Prominent circumferential colonic wall thickening
involving the distal transverse colon and the descending colon with
mild pericolonic fat stranding. No extraluminal gas or organized
pericolonic fluid collection. Stomach within normal limits. No
dilated loops of bowel to suggest obstruction. A normal appendix is
present in the right lower quadrant (series 2, image 67).

Vascular/Lymphatic: No significant vascular findings are present. No
enlarged abdominal or pelvic lymph nodes.

Reproductive: Prior hysterectomy. Two right adnexal cysts measuring
up to 4.4 cm and 3.8 cm. No follow-up imaging recommended. Left
adnexal region is within normal limits.

Other: No free fluid. No abdominopelvic fluid collection. No
pneumoperitoneum. Small fat containing umbilical hernia.

Musculoskeletal: No acute or significant osseous findings.
IMPRESSION: Prominent circumferential colonic wall thickening involving the
distal transverse colon and the descending colon with mild
pericolonic fat stranding, suggestive of an infectious or
inflammatory colitis.

## 2022-06-14 ENCOUNTER — Telehealth: Payer: Self-pay | Admitting: Neurology

## 2022-06-14 NOTE — Telephone Encounter (Signed)
Patient called and wanted to schedule her sleep study.  She requested the home sleep study.  MAIL OUT HST- [employer] no auth req   She is on the schedule for 06/27/22.

## 2022-06-27 ENCOUNTER — Ambulatory Visit (INDEPENDENT_AMBULATORY_CARE_PROVIDER_SITE_OTHER): Payer: BC Managed Care – PPO | Admitting: Neurology

## 2022-06-27 DIAGNOSIS — G4710 Hypersomnia, unspecified: Secondary | ICD-10-CM

## 2022-06-27 DIAGNOSIS — R0683 Snoring: Secondary | ICD-10-CM

## 2022-06-27 DIAGNOSIS — G478 Other sleep disorders: Secondary | ICD-10-CM

## 2022-06-27 DIAGNOSIS — G4701 Insomnia due to medical condition: Secondary | ICD-10-CM

## 2022-06-27 DIAGNOSIS — E663 Overweight: Secondary | ICD-10-CM

## 2022-07-10 NOTE — Progress Notes (Signed)
[facility]   HOME SLEEP TEST REPORT ( MAIL out by [facility])   STUDY DATA:  07-10-2022 Julie [name] 47 year old female [birth year]   ORDERING CLINICIAN: Melvyn Novas, MD  CLINICAL INFORMATION/HISTORY: 09-21-2021,  referral by Dr Thomasena Edis, DO, for hypersomnia.  Patient snores heavily for many years, ENT recommended a sleep study, fiancee witnessed snoring and apnea. Respiratory allergies were noted, GERD, also fragmentation of sleep without known cause. Insomnia in her case is not the inability to initiate sleep but to sustain sleep.      Epworth sleepiness score:7-12 /24. FSS at 35/ 63 points.    BMI: 30 kg/m   Neck Circumference: 16'   FINDINGS:   Sleep Summary:   Total Recording Time (hours, min):   7 hours 6 minutes  Total Sleep Time (hours, min):      6 hours 11 minutes           Percent REM (%): 28%                                       Respiratory Indices:   Calculated pAHI (per hour):     6.2/h                        REM pAHI:       12.6/h                                          NREM pAHI: 3.9/h                            Supine AHI: This patient slept about 18 minutes in supine position associated with an AHI of 7.7 but 282 minutes on her right side with an AHI of 5.3/h.  Snoring reached a mean volume of 40 dB which is just the threshold.  It was present for less than 8% of total sleep time                                                 Oxygen Saturation Statistics: No           O2 Saturation Range (%): Between a nadir of 89 and a maximum of 99% saturation with a mean saturation of 97%                                     O2 Saturation (minutes) <89%:     0 minutes      Pulse Rate Statistics:   Pulse Mean (bpm):    83             Pulse Range:    Between 67 and 102 bpm             IMPRESSION:  This HST confirms the presence of very mild sleep apnea.  There is a much stronger REM sleep AHI seen in comparison to the  non-REM sleep AHI.  Usually the recommendation is positive airway pressure therapy but overall I would consider this patient a candidate to try CPAP and see if it works for her.   I do not think that there are great healthcare risks associated with this mild apnea.  Snoring was basically not present and there was no hypoxia.   RECOMMENDATION: If the patient is willing to try CPAP I would offer an auto titration CPAP between 5 and 20 cmH2O pressure was 2 cm EPR heated humidifier and mask of choice.    I do not think that this sleep study result correlates with the clinical or symptom observation by her family describing her as a snorer who got stops for air at night.  Neither was the sleep very fragmented.    INTERPRETING PHYSICIAN:   Melvyn Novas, MD   Medical Director of [facility] at [facility].

## 2022-07-18 ENCOUNTER — Telehealth: Payer: Self-pay | Admitting: Neurology

## 2022-07-18 NOTE — Telephone Encounter (Signed)
-----   Message from Melvyn Novas, MD sent at 07/18/2022 12:37 PM EDT -----  This HST confirms the presence of very mild sleep apnea.  There is a much stronger REM sleep AHI seen in comparison to the non-REM sleep AHI.  Usually the recommendation is positive airway pressure therapy but overall I would consider this patient a candidate to try CPAP and see if it works for her.   I do not think that there are great healthcare risks associated with this mild apnea.  Snoring was basically not present and there was no hypoxia.   RECOMMENDATION: If the patient is willing to try CPAP I would offer an auto titration CPAP between 5 and 20 cmH2O pressure was 2 cm EPR heated humidifier and mask of choice.     I do not think that this sleep study result correlates with the clinical or symptom observation by her family describing her as a snorer who got stops for air at night.  Neither was the sleep very fragmented.

## 2022-07-18 NOTE — Telephone Encounter (Signed)
Called patient to discuss sleep study results. No answer at this time. LVM for the patient to call back.  Will send a mychart message as well. 

## 2022-07-18 NOTE — Telephone Encounter (Signed)
I called pt. I advised pt that Dr. Vickey Huger reviewed their sleep study results and found that pt has mild sleep apnea. Dr. Vickey Huger recommends that pt starts auto CPAP. I reviewed PAP compliance expectations with the pt. Pt is agreeable to starting a CPAP. I advised pt that an order will be sent to a DME, [facility], and [facility] will call the pt within about one week after they file with the pt's insurance. [facility] will show the pt how to use the machine, fit for masks, and troubleshoot the CPAP if needed. A follow up appt was made for insurance purposes with Dr. Vickey Huger on 09/28/2022 at 3:30 pm. Pt verbalized understanding to arrive 15 minutes early and bring their CPAP. Pt verbalized understanding of results. Pt had no questions at this time but was encouraged to call back if questions arise. I have sent the order to [facility] and have received confirmation that they have received the order.

## 2022-07-18 NOTE — Procedures (Signed)
[facility] at [facility]   HOME SLEEP TEST REPORT ( MAIL out by [facility] PAT)   STUDY DATA:  07-10-2022 Julie [name] 47 year old [name] Nov 07, 1975   ORDERING CLINICIAN: Melvyn Novas, MD  CLINICAL INFORMATION/HISTORY: 09-21-2021,  referral by Dr Thomasena Edis, DO, for hypersomnia.  Patient snores heavily for many years, ENT recommended a sleep study, fiancee witnessed snoring and apnea. Respiratory allergies were noted, GERD, also fragmentation of sleep without known cause. Insomnia in her case is not the inability to initiate sleep but to sustain sleep.      Epworth sleepiness score:7-12 /24. FSS at 35/ 63 points.    BMI: 30 kg/m   Neck Circumference: 16'   FINDINGS:   Sleep Summary:   Total Recording Time (hours, min):   7 hours 6 minutes  Total Sleep Time (hours, min):      6 hours 11 minutes           Percent REM (%): 28%                                       Respiratory Indices:   Calculated pAHI (per hour):     6.2/h                        REM pAHI:       12.6/h                                          NREM pAHI: 3.9/h                            Supine AHI: This patient slept about 18 minutes in supine position associated with an AHI of 7.7 but 282 minutes on her right side with an AHI of 5.3/h.  Snoring reached a mean volume of 40 dB which is just the threshold.  It was present for less than 8% of total sleep time                                                 Oxygen Saturation Statistics: No           O2 Saturation Range (%): Between a nadir of 89 and a maximum of 99% saturation with a mean saturation of 97%                                     O2 Saturation (minutes) <89%:     0 minutes      Pulse Rate Statistics:   Pulse Mean (bpm):    83             Pulse Range:    Between 67 and 102 bpm             IMPRESSION:  This HST confirms the presence of very mild sleep apnea.  There is a much stronger REM sleep AHI seen in comparison to the non-REM sleep AHI.   Usually the recommendation is positive airway pressure therapy but  overall I would consider this patient a candidate to try CPAP and see if it works for her.   I do not think that there are great healthcare risks associated with this mild apnea.  Snoring was basically not present and there was no hypoxia.   RECOMMENDATION: If the patient is willing to try CPAP I would offer an auto titration CPAP between 5 and 20 cmH2O pressure was 2 cm EPR heated humidifier and mask of choice.    I do not think that this sleep study result correlates with the clinical or symptom observation by her family describing her as a snorer who got stops for air at night.  Neither was the sleep very fragmented.    INTERPRETING PHYSICIAN:   Melvyn Novas, MD   Medical Director of [facility].

## 2022-07-19 ENCOUNTER — Other Ambulatory Visit: Payer: Self-pay | Admitting: Neurology

## 2022-07-19 DIAGNOSIS — G4710 Hypersomnia, unspecified: Secondary | ICD-10-CM

## 2022-07-19 DIAGNOSIS — R0683 Snoring: Secondary | ICD-10-CM

## 2022-07-19 DIAGNOSIS — E663 Overweight: Secondary | ICD-10-CM

## 2022-07-19 DIAGNOSIS — G4701 Insomnia due to medical condition: Secondary | ICD-10-CM

## 2022-07-19 DIAGNOSIS — G478 Other sleep disorders: Secondary | ICD-10-CM

## 2022-09-28 ENCOUNTER — Ambulatory Visit: Payer: BC Managed Care – PPO | Admitting: Neurology

## 2022-10-03 ENCOUNTER — Telehealth: Payer: Self-pay

## 2022-10-03 NOTE — Telephone Encounter (Signed)
Received fax from [facility] that states they were unable to reach out to the patient and schedule appointment. [facility] would like to hear from the patient before 10/12/22 are they would assume the patient do not want the equipment and your order will be cancelled.

## 2022-10-17 ENCOUNTER — Telehealth: Payer: Self-pay

## 2022-10-17 NOTE — Telephone Encounter (Signed)
Received fax from [facility] states they have made numerous attempts to contact the patient to coordinate the set-up. The patient has not returned the calls and a mailing notify to the patient . Patient should contact them @ [phone].

## 2023-12-11 ENCOUNTER — Other Ambulatory Visit (HOSPITAL_BASED_OUTPATIENT_CLINIC_OR_DEPARTMENT_OTHER): Payer: Self-pay | Admitting: Family Medicine

## 2023-12-11 DIAGNOSIS — E1121 Type 2 diabetes mellitus with diabetic nephropathy: Secondary | ICD-10-CM

## 2023-12-11 DIAGNOSIS — R5383 Other fatigue: Secondary | ICD-10-CM

## 2023-12-11 DIAGNOSIS — E785 Hyperlipidemia, unspecified: Secondary | ICD-10-CM

## 2023-12-27 ENCOUNTER — Other Ambulatory Visit (HOSPITAL_BASED_OUTPATIENT_CLINIC_OR_DEPARTMENT_OTHER): Payer: Self-pay

## 2024-01-08 ENCOUNTER — Ambulatory Visit (HOSPITAL_BASED_OUTPATIENT_CLINIC_OR_DEPARTMENT_OTHER)
Admission: RE | Admit: 2024-01-08 | Discharge: 2024-01-08 | Disposition: A | Discharge status: Home / Self Care | Payer: Self-pay | Source: Ambulatory Visit | Attending: Family Medicine | Admitting: Family Medicine

## 2024-01-08 DIAGNOSIS — R5383 Other fatigue: Secondary | ICD-10-CM

## 2024-01-08 DIAGNOSIS — E785 Hyperlipidemia, unspecified: Secondary | ICD-10-CM

## 2024-01-08 DIAGNOSIS — E1121 Type 2 diabetes mellitus with diabetic nephropathy: Secondary | ICD-10-CM
